# Patient Record
Sex: Female | Born: 1995 | Race: Black or African American | Hispanic: No | Smoking: Never smoker
Health system: Southern US, Community
[De-identification: ages and names within clinical notes are randomized; demographics above are authoritative.]

## PROBLEM LIST (undated history)

## (undated) ENCOUNTER — Ambulatory Visit: Admission: EM | Source: Home / Self Care

## (undated) DIAGNOSIS — G932 Benign intracranial hypertension: Secondary | ICD-10-CM

## (undated) DIAGNOSIS — E282 Polycystic ovarian syndrome: Secondary | ICD-10-CM

## (undated) DIAGNOSIS — G43909 Migraine, unspecified, not intractable, without status migrainosus: Secondary | ICD-10-CM

---

## 2017-10-23 DIAGNOSIS — L853 Xerosis cutis: Secondary | ICD-10-CM | POA: Insufficient documentation

## 2017-11-07 DIAGNOSIS — J029 Acute pharyngitis, unspecified: Secondary | ICD-10-CM | POA: Insufficient documentation

## 2017-12-19 DIAGNOSIS — S6991XA Unspecified injury of right wrist, hand and finger(s), initial encounter: Secondary | ICD-10-CM | POA: Insufficient documentation

## 2020-03-24 DIAGNOSIS — R519 Headache, unspecified: Secondary | ICD-10-CM | POA: Insufficient documentation

## 2020-05-01 DIAGNOSIS — Z6841 Body Mass Index (BMI) 40.0 and over, adult: Secondary | ICD-10-CM | POA: Insufficient documentation

## 2020-09-14 DIAGNOSIS — H66003 Acute suppurative otitis media without spontaneous rupture of ear drum, bilateral: Secondary | ICD-10-CM | POA: Insufficient documentation

## 2021-09-30 ENCOUNTER — Emergency Department
Admission: EM | Admit: 2021-09-30 | Discharge: 2021-09-30 | Disposition: A | Discharge status: Home / Self Care | Payer: BC Managed Care – PPO | Attending: Emergency Medicine | Admitting: Emergency Medicine

## 2021-09-30 ENCOUNTER — Other Ambulatory Visit: Payer: Self-pay

## 2021-09-30 ENCOUNTER — Encounter: Payer: Self-pay | Admitting: Emergency Medicine

## 2021-09-30 DIAGNOSIS — J0100 Acute maxillary sinusitis, unspecified: Secondary | ICD-10-CM | POA: Insufficient documentation

## 2021-09-30 DIAGNOSIS — Z20822 Contact with and (suspected) exposure to covid-19: Secondary | ICD-10-CM | POA: Diagnosis not present

## 2021-09-30 DIAGNOSIS — R519 Headache, unspecified: Secondary | ICD-10-CM | POA: Diagnosis present

## 2021-09-30 HISTORY — DX: Benign intracranial hypertension: G93.2

## 2021-09-30 HISTORY — DX: Migraine, unspecified, not intractable, without status migrainosus: G43.909

## 2021-09-30 LAB — RESP PANEL BY RT-PCR (FLU A&B, COVID) ARPGX2
Influenza A by PCR: NEGATIVE
Influenza B by PCR: NEGATIVE
SARS Coronavirus 2 by RT PCR: NEGATIVE

## 2021-09-30 MED ORDER — DEXAMETHASONE 6 MG PO TABS
10.0000 mg | ORAL_TABLET | Freq: Once | ORAL | Status: AC
  Administered 2021-09-30: 10 mg via ORAL
  Filled 2021-09-30: qty 1

## 2021-09-30 MED ORDER — CETIRIZINE HCL 10 MG PO TABS: 10.0000 mg | ORAL_TABLET | Freq: Every day | ORAL | 1 refills | Status: DC

## 2021-09-30 MED ORDER — IBUPROFEN 800 MG PO TABS
800.0000 mg | ORAL_TABLET | Freq: Once | ORAL | Status: AC
  Administered 2021-09-30: 800 mg via ORAL
  Filled 2021-09-30: qty 1

## 2021-09-30 MED ORDER — FLUTICASONE PROPIONATE 50 MCG/ACT NA SUSP: 1.0000 | Freq: Every day | NASAL | 1 refills | Status: DC

## 2021-09-30 NOTE — ED Triage Notes (Signed)
Pt c/o L sided facial pain and L earache and nasal congestion x 2-3 days, states last known sick exposure approx 2 weeks ago.  ?

## 2021-09-30 NOTE — ED Provider Notes (Signed)
? ?[facility] ?Provider Note ? ? ? Event Date/Time  ? First MD Initiated Contact with Patient 09/30/21 0522   ?  (approximate) ? ? ?History  ? ?Facial Pain ? ? ?HPI ? ?Alexis Chandler is a 26 y.o. female with history of pseudotumor cerebri, migraines who presents to the emergency department with complaints of left-sided headache behind her eye, sinus pressure, nasal congestion for the past 2 days.  No fevers.  No chest pain or shortness of breath.  No vomiting or diarrhea.  No known sick contacts.  She denies any vision changes, numbness, tingling or weakness.  States she started having bilateral ear pain as well. ? ? ?History provided by patient. ? ? ? ?Past Medical History:  ?Diagnosis Date  ? Migraine   ? Pseudotumor cerebri   ? ? ?Past Surgical History:  ?Procedure Laterality Date  ? CESAREAN SECTION    ? ? ?MEDICATIONS:  ?Prior to Admission medications   ?Medication Sig Start Date End Date Taking? Authorizing Provider  ?cetirizine (ZYRTEC ALLERGY) 10 MG tablet Take 1 tablet (10 mg total) by mouth daily. 09/30/21 09/30/22 Yes Emelin Dascenzo, Layla Maw, DO  ?fluticasone (FLONASE) 50 MCG/ACT nasal spray Place 1 spray into both nostrils daily. 09/30/21 09/30/22 Yes Yaresly Menzel, Layla Maw, DO  ? ? ?Physical Exam  ? ?Triage Vital Signs: ?ED Triage Vitals [09/30/21 0239]  ?Enc Vitals Group  ?   BP 140/82  ?   Pulse Rate 99  ?   Resp 20  ?   Temp 99 ?F (37.2 ?C)  ?   Temp Source Oral  ?   SpO2 95 %  ?   Weight 230 lb (104.3 kg)  ?   Height 5\' 4"  (1.626 m)  ?   Head Circumference   ?   Peak Flow   ?   Pain Score 6  ?   Pain Loc   ?   Pain Edu?   ?   Excl. in [place]?   ? ? ?Most recent vital signs: ?Vitals:  ? 09/30/21 0239 09/30/21 0557  ?BP: 140/82 116/81  ?Pulse: 99 84  ?Resp: 20 18  ?Temp: 99 ?F (37.2 ?C)   ?SpO2: 95% 99%  ? ? ?CONSTITUTIONAL: Alert and oriented and responds appropriately to questions. Well-appearing; well-nourished ?HEAD: Normocephalic, atraumatic ?EYES: Conjunctivae clear, pupils appear equal, sclera  nonicteric ?ENT: normal nose; moist mucous membranes; No pharyngeal erythema or petechiae, no tonsillar hypertrophy or exudate, no uvular deviation, no unilateral swelling in posterior oropharynx, no trismus or drooling, no muffled voice, normal phonation, no stridor, airway patent.  TMs are clear bilaterally without erythema, purulence, bulging, perforation.  No cerumen impaction or sign of foreign body in the external auditory canal. No inflammation, erythema or drainage from the external auditory canal. No signs of mastoiditis. No pain with manipulation of the pinna bilaterally.  She does have clear effusions noted behind TMs bilaterally. ?NECK: Supple, normal ROM, no meningismus ?CARD: RRR; S1 and S2 appreciated; no murmurs, no clicks, no rubs, no gallops ?RESP: Normal chest excursion without splinting or tachypnea; breath sounds clear and equal bilaterally; no wheezes, no rhonchi, no rales, no hypoxia or respiratory distress, speaking full sentences ?ABD/GI: Normal bowel sounds; non-distended; soft, non-tender, no rebound, no guarding, no peritoneal signs ?BACK: The back appears normal ?EXT: Normal ROM in all joints; no deformity noted, no edema; no cyanosis ?SKIN: Normal color for age and race; warm; no rash on exposed skin ?NEURO: Moves all extremities equally, normal speech, normal sensation  diffusely, no facial asymmetry, normal gait ?PSYCH: The patient's mood and manner are appropriate. ? ? ?ED Results / Procedures / Treatments  ? ?LABS: ?(all labs ordered are listed, but only abnormal results are displayed) ?Labs Reviewed  ?RESP PANEL BY RT-PCR (FLU A&B, COVID) [id number]  ? ? ? ?EKG: ? ? ?RADIOLOGY: ?My personal review and interpretation of imaging:   ? ?I have personally reviewed all radiology reports.   ?No results found. ? ? ?PROCEDURES: ? ?Critical Care performed: No ? ? ?CRITICAL CARE ?Performed by: Baxter Hire Marquisa Salih ? ? ?Total critical care time: 0 minutes ? ?Critical care time was exclusive of separately  billable procedures and treating other patients. ? ?Critical care was necessary to treat or prevent imminent or life-threatening deterioration. ? ?Critical care was time spent personally by me on the following activities: development of treatment plan with patient and/or surrogate as well as nursing, discussions with consultants, evaluation of patient's response to treatment, examination of patient, obtaining history from patient or surrogate, ordering and performing treatments and interventions, ordering and review of laboratory studies, ordering and review of radiographic studies, pulse oximetry and re-evaluation of patient's condition. ? ? ?Procedures ? ? ? ?IMPRESSION / MDM / ASSESSMENT AND PLAN / ED COURSE  ?I reviewed the triage vital signs and the nursing notes. ? ? ? ?Patient here with complaints of headache, sinus pressure, ear pain, congestion. ? ? ? ?DIFFERENTIAL DIAGNOSIS (includes but not limited to):   Viral illness, allergies, sinusitis, COVID, influenza, otitis media, otitis externa, mastoiditis, pharyngitis ? ? ?PLAN: We will obtain COVID and flu swabs.  Will give ibuprofen and Decadron for symptomatic relief.  Clinically she has no sign of bacterial infection on exam and is well-appearing.  She states this does not feel like her pseudotumor and she has no focal neurologic deficits are vision changes.  She does describe the headache as a "migraine".  I have offered her IV medications such as a migraine cocktail however she declined stating that she would just like to do oral medication. ? ? ?MEDICATIONS GIVEN IN ED: ?Medications  ?ibuprofen (ADVIL) tablet 800 mg (800 mg Oral Given 09/30/21 0602)  ?dexamethasone (DECADRON) tablet 10 mg (10 mg Oral Given 09/30/21 0601)  ? ? ? ?ED COURSE: Patient's COVID and flu swabs were negative.  She states she does not want to wait any longer to see if the medications are making her feel better as she would like to go home because she has a young child with her.   Discussed at length return precautions and recommended alternating Tylenol, Motrin over-the-counter for pain.  Will discharge with Zyrtec and Flonase.  I do not feel that she needs antibiotics today.  We discussed return precautions.  I do not think that she needs a lumbar tap.  Low suspicion for pseudotumor, meningitis. ? ? ?At this time, I do not feel there is any life-threatening condition present. I reviewed all nursing notes, vitals, pertinent previous records.  All lab and urine results, EKGs, imaging ordered have been independently reviewed and interpreted by myself.  I reviewed all available radiology reports from any imaging ordered this visit.  Based on my assessment, I feel the patient is safe to be discharged home without further emergent workup and can continue workup as an outpatient as needed. Discussed all findings, treatment plan as well as usual and customary return precautions with patient.  They verbalize understanding and are comfortable with this plan.  Outpatient follow-up has been provided as needed.  All  questions have been answered. ? ? ? ?CONSULTS: No admission needed at this time given patient well-appearing, hemodynamically stable and neurologically intact. ? ? ?OUTSIDE RECORDS REVIEWED: Reviewed patient's last office visit with family medicine with Earney Mallet on 09/13/2020. ? ? ? ? ? ? ? ? ?FINAL CLINICAL IMPRESSION(S) / ED DIAGNOSES  ? ?Final diagnoses:  ?Acute non-recurrent maxillary sinusitis  ? ? ? ?Rx / DC Orders  ? ?ED Discharge Orders   ? ?      Ordered  ?  fluticasone (FLONASE) 50 MCG/ACT nasal spray  Daily       ? 09/30/21 0538  ?  cetirizine (ZYRTEC ALLERGY) 10 MG tablet  Daily       ? 09/30/21 0538  ? ?  ?  ? ?  ? ? ? ?Note:  This document was prepared using Dragon voice recognition software and may include unintentional dictation errors. ?  ?Elowen Debruyn, Layla Maw, DO ?09/30/21 1683 ? ?

## 2021-09-30 NOTE — Discharge Instructions (Addendum)
You may alternate Tylenol 1000 mg every 6 hours as needed for pain, fever and Ibuprofen 800 mg every 6-8 hours as needed for pain, fever.  Please take Ibuprofen with food.  Do not take more than 4000 mg of Tylenol (acetaminophen) in a 24 hour period.  Steps to find a Primary Care Provider (PCP):  Call [phone] or [phone] to access "[facility] Find a Doctor Service."  2.  You may also go on the [place] [facility] website at [email or web address]  

## 2021-10-21 ENCOUNTER — Emergency Department: Payer: BC Managed Care – PPO

## 2021-10-21 ENCOUNTER — Other Ambulatory Visit: Payer: Self-pay

## 2021-10-21 ENCOUNTER — Emergency Department
Admission: EM | Admit: 2021-10-21 | Discharge: 2021-10-21 | Disposition: A | Discharge status: Home / Self Care | Payer: BC Managed Care – PPO | Attending: Emergency Medicine | Admitting: Emergency Medicine

## 2021-10-21 DIAGNOSIS — R519 Headache, unspecified: Secondary | ICD-10-CM | POA: Diagnosis present

## 2021-10-21 DIAGNOSIS — H538 Other visual disturbances: Secondary | ICD-10-CM | POA: Diagnosis not present

## 2021-10-21 DIAGNOSIS — G932 Benign intracranial hypertension: Secondary | ICD-10-CM

## 2021-10-21 LAB — BASIC METABOLIC PANEL
Anion gap: 10 (ref 5–15)
BUN: 10 mg/dL (ref 6–20)
CO2: 23 mmol/L (ref 22–32)
Calcium: 8.8 mg/dL — ABNORMAL LOW (ref 8.9–10.3)
Chloride: 108 mmol/L (ref 98–111)
Creatinine, Ser: 0.78 mg/dL (ref 0.44–1.00)
GFR, Estimated: >60 mL/min (ref >60)
Glucose, Bld: 114 mg/dL — ABNORMAL HIGH (ref 70–99)
Potassium: 3.7 mmol/L (ref 3.5–5.1)
Sodium: 141 mmol/L (ref 135–145)

## 2021-10-21 LAB — HCG, QUANTITATIVE, PREGNANCY: hCG, Beta Chain, Quant, S: <1 m[IU]/mL (ref <5)

## 2021-10-21 LAB — CBC
HCT: 36 % (ref 36.0–46.0)
Hemoglobin: 11.3 g/dL — ABNORMAL LOW (ref 12.0–15.0)
MCH: 26 pg (ref 26.0–34.0)
MCHC: 31.4 g/dL (ref 30.0–36.0)
MCV: 82.9 fL (ref 80.0–100.0)
Platelets: 372 10*3/uL (ref 150–400)
RBC: 4.34 MIL/uL (ref 3.87–5.11)
RDW: 14.5 % (ref 11.5–15.5)
WBC: 9.3 10*3/uL (ref 4.0–10.5)
nRBC: 0 % (ref 0.0–0.2)

## 2021-10-21 MED ORDER — METOCLOPRAMIDE HCL 5 MG/ML IJ SOLN
10.0000 mg | Freq: Once | INTRAMUSCULAR | Status: AC
  Administered 2021-10-21: 10 mg via INTRAVENOUS
  Filled 2021-10-21: qty 2

## 2021-10-21 MED ORDER — ACETAZOLAMIDE 250 MG PO TABS
500.0000 mg | ORAL_TABLET | Freq: Once | ORAL | Status: AC
Start: 2021-10-21 — End: 2021-10-21
  Administered 2021-10-21: 500 mg via ORAL
  Filled 2021-10-21: qty 2

## 2021-10-21 MED ORDER — SODIUM CHLORIDE 0.9 % IV BOLUS
1000.0000 mL | Freq: Once | INTRAVENOUS | Status: AC
Start: 2021-10-21 — End: 2021-10-21
  Administered 2021-10-21: 1000 mL via INTRAVENOUS

## 2021-10-21 MED ORDER — KETOROLAC TROMETHAMINE 30 MG/ML IJ SOLN
30.0000 mg | Freq: Once | INTRAMUSCULAR | Status: AC
  Administered 2021-10-21: 30 mg via INTRAVENOUS
  Filled 2021-10-21: qty 1

## 2021-10-21 MED ORDER — DIPHENHYDRAMINE HCL 50 MG/ML IJ SOLN
50.0000 mg | Freq: Once | INTRAMUSCULAR | Status: AC
  Administered 2021-10-21: 50 mg via INTRAVENOUS
  Filled 2021-10-21: qty 1

## 2021-10-21 MED ORDER — ACETAZOLAMIDE ER 500 MG PO CP12: 500.0000 mg | ORAL_CAPSULE | Freq: Two times a day (BID) | ORAL | 2 refills | Status: DC

## 2021-10-21 NOTE — ED Triage Notes (Signed)
Pt states has pseudotumor cerebri. Pt states since 0900 this am she has had a "white film" over her left eye with associated headache and left eye pressure. Pt denies nausea, known fever.  ?

## 2021-10-21 NOTE — ED Notes (Signed)
Per provider, the Diamox is to be held until pregnancy status is determined. Patient has been medicated and fluids hung. She states she does not need to urinate at this time. Will check back shortly and attempt to get urine specimen at that time. Provider is aware. Warm blanket provided to patient. ?

## 2021-10-21 NOTE — Discharge Instructions (Addendum)
Please begin taking medication starting tomorrow 10/22/2021 twice daily.  Please follow-up with neurology by calling the number provided tomorrow morning to arrange a follow-up appointment as soon as possible preferably within the next 2 to 3 weeks.  Return to the emergency department if your vision does not improve over the next several days or if at any point it appears to worsen. ?Please also call the number provided for ophthalmology tomorrow morning to arrange a follow-up appointment for further evaluation. ?

## 2021-10-21 NOTE — ED Provider Notes (Signed)
? ?[facility] ?Provider Note ? ? ? Event Date/Time  ? First MD Initiated Contact with Patient 10/21/21 1952   ?  (approximate) ? ?History  ? ?Chief Complaint: Pressure Behind the Eyes ? ?HPI ? ?Alexis Chandler is a 26 y.o. female with a past medical history of pseudotumor cerebri who presents to the emergency department for headache and visual symptoms.  According to the patient since this morning she has been experiencing a migraine type headache, states she also has a history of migraines.  States she has noted some pressure and slight haze/blurred vision in her left eye.  Patient denies any weakness or numbness of any arm or leg confusion or slurred speech.  Patient states a history of migraine headaches in the past.  Patient was diagnosed with pseudotumor cerebri follows up with neurology but has never had a lumbar puncture.  Last menstrual period was last week. ? ?Physical Exam  ? ?Triage Vital Signs: ?ED Triage Vitals [10/21/21 1917]  ?Enc Vitals Group  ?   BP (!) 141/88  ?   Pulse Rate 79  ?   Resp 16  ?   Temp 98 ?F (36.7 ?C)  ?   Temp Source Oral  ?   SpO2 99 %  ?   Weight 240 lb (108.9 kg)  ?   Height 5\' 4"  (1.626 m)  ?   Head Circumference   ?   Peak Flow   ?   Pain Score 8  ?   Pain Loc   ?   Pain Edu?   ?   Excl. in [place]?   ? ? ?Most recent vital signs: ?Vitals:  ? 10/21/21 1917  ?BP: (!) 141/88  ?Pulse: 79  ?Resp: 16  ?Temp: 98 ?F (36.7 ?C)  ?SpO2: 99%  ? ? ?General: Awake, no distress.  ?CV:  Good peripheral perfusion.  Regular rate and rhythm  ?Resp:  Normal effort.  Equal breath sounds bilaterally.  ?Abd:  No distention.  Soft, nontender.  No rebound or guarding. ?Other:  Well-appearing, nontoxic. ? ? ?ED Results / Procedures / Treatments  ? ?RADIOLOGY ? ?I personally reviewed the CT images no acute abnormality seen on my evaluation. ?Radiology is read the CT as fluid attenuation around the intraorbital optic nerves likely reflecting prominent subarachnoid spaces suggesting  increased intracranial pressure. ? ? ?MEDICATIONS ORDERED IN ED: ?Medications  ?ketorolac (TORADOL) 30 MG/ML injection 30 mg (has no administration in time range)  ?metoCLOPramide (REGLAN) injection 10 mg (has no administration in time range)  ?diphenhydrAMINE (BENADRYL) injection 50 mg (has no administration in time range)  ?sodium chloride 0.9 % bolus 1,000 mL (has no administration in time range)  ? ? ? ?IMPRESSION / MDM / ASSESSMENT AND PLAN / ED COURSE  ?I reviewed the triage vital signs and the nursing notes. ? ?Patient presents to the emergency department with a headache and some left visual haziness/blurriness.  Patient states headache since this morning.  Feels like a migraine has a history of migraines in the past.  Patient states he was also diagnosed with pseudotumor cerebri by her neurologist but has never had a lumbar puncture.  Given the patient's history of migraines we will treat the patient's migraine with Toradol Reglan Benadryl and IV fluids.  We will continue to closely monitor to see if symptoms improve.  If symptoms improve with treatment anticipate discharge home.  If symptoms fail to improve we could attempt a lumbar puncture although this could be quite difficult  given body habitus.  Patient is agreeable to this plan. ? ?Patient states she is feeling better after medications states she continues to have a slight visual disturbance in her left eye but is much better.  Suspect is likely related to pseudotumor cerebri we will start the patient on acetazolamide 500 mg twice daily and have the patient follow-up with neurology.  Discussed return precautions if the visual symptoms do not improve or if anytime they worsen.  Patient agreeable to plan. ? ?FINAL CLINICAL IMPRESSION(S) / ED DIAGNOSES  ? ?Headache ? ?Rx / DC Orders  ? ?Acetazolamide 500 mg twice daily ? ?Note:  This document was prepared using Dragon voice recognition software and may include unintentional dictation errors. ?   Harvest Dark, MD ?10/21/21 2245 ? ?

## 2021-10-21 NOTE — ED Notes (Addendum)
This nurse noted that the HCG still had not resulted and it was discovered that the test hadnt been started. I requested that they please expedite that so that I may administer a medication.I was told they would complete it immediately. Provider was made aware. ? ?Patient is resting comfortably on stretcher with eyes closed. RR even and unlabored. Patient verbalizes no new complaints or needs at this time. Visitor at bedside. ?

## 2021-10-22 ENCOUNTER — Emergency Department: Payer: BC Managed Care – PPO | Admitting: Radiology

## 2021-10-22 ENCOUNTER — Emergency Department
Admission: EM | Admit: 2021-10-22 | Discharge: 2021-10-22 | Disposition: A | Discharge status: Home / Self Care | Payer: BC Managed Care – PPO | Attending: Emergency Medicine | Admitting: Emergency Medicine

## 2021-10-22 ENCOUNTER — Other Ambulatory Visit: Payer: Self-pay

## 2021-10-22 DIAGNOSIS — G932 Benign intracranial hypertension: Secondary | ICD-10-CM | POA: Diagnosis not present

## 2021-10-22 DIAGNOSIS — R519 Headache, unspecified: Secondary | ICD-10-CM | POA: Diagnosis present

## 2021-10-22 HISTORY — PX: IR FL GUIDED LOC OF NEEDLE/CATH TIP FOR SPINAL INJECTION LT: IMG2396

## 2021-10-22 LAB — CSF CELL COUNT WITH DIFFERENTIAL
RBC Count, CSF: 0 /mm3 (ref 0–3)
Tube #: 3
WBC, CSF: 0 /mm3 (ref 0–5)

## 2021-10-22 LAB — PROTEIN AND GLUCOSE, CSF
Glucose, CSF: 51 mg/dL (ref 40–70)
Total  Protein, CSF: 22 mg/dL (ref 15–45)

## 2021-10-22 MED ORDER — METOCLOPRAMIDE HCL 5 MG/ML IJ SOLN
10.0000 mg | Freq: Once | INTRAMUSCULAR | Status: AC
  Administered 2021-10-22: 10 mg via INTRAVENOUS
  Filled 2021-10-22: qty 2

## 2021-10-22 MED ORDER — KETOROLAC TROMETHAMINE 30 MG/ML IJ SOLN
15.0000 mg | Freq: Once | INTRAMUSCULAR | Status: AC
  Administered 2021-10-22: 15 mg via INTRAVENOUS
  Filled 2021-10-22: qty 1

## 2021-10-22 MED ORDER — LIDOCAINE HCL 1 % IJ SOLN
INTRAMUSCULAR | Status: AC
Start: 2021-10-22 — End: 2021-10-22
  Administered 2021-10-22: 6 mL
  Filled 2021-10-22: qty 20

## 2021-10-22 NOTE — Discharge Instructions (Addendum)
Please continue to take the Diamox as prescribed.  Is important that you follow-up with both ophthalmology and neurology. ?

## 2021-10-22 NOTE — ED Triage Notes (Signed)
First Nurse Note:  Seen last night for pseudotumor cerebri.  States c/o lower facial numbness and left hand numbness this morning. ? ?AAOx3.  Skin warm and dry. Speech clear. Facial movmenets equal. MAE equally and strong. NAD ?

## 2021-10-22 NOTE — Procedures (Signed)
PROCEDURE SUMMARY: ? ?Successful fluoroscopic guided Lumbar puncture ?Opening pressure 36 cm of H20 ?Yielded 20 mL of colorless CSF fluid ?Closing pressure 22 cm of H20 ?No immediate complications.  ?Pt tolerated well.  ? ?Specimen was sent for labs. ? ?EBL < 42mL ? ?Pattricia Boss D PA-C ?10/22/2021 ?5:37 PM ? ? ? ?

## 2021-10-22 NOTE — ED Notes (Signed)
Pt returned from MRI, pt educated to lay flat for 1 hour, pt verbalized understanding. NAD noted. S/O at bedside.  ?

## 2021-10-22 NOTE — ED Notes (Signed)
Visual acuity: ?R eye 20/70 ?L eye 20/100 ?Both eyes 20/40 ? ?Pt denies the need to wear glasses or contact lenses normally.  ?

## 2021-10-22 NOTE — ED Triage Notes (Signed)
See first nurse note- States she was seen here last night for a mirgaine related to pseudotumor cerebri. This morning woke up with bilateral hand numbness and lower facial numbness. Still having a migraine type headache, some pressure and slight blurred vision in her left eye. Reports she was given Reglan/ benadryl/ toradol and this initially helped the headache but it has since returned.  ? ?Denies any weakness/ slurred speech. Does report some disorientation.  ?

## 2021-10-22 NOTE — ED Provider Notes (Signed)
----------------------------------------- ?  3:54 PM on 10/22/2021 ?----------------------------------------- ? ?Blood pressure (!) 149/77, pulse 79, temperature 98.6 ?F (37 ?C), temperature source Oral, resp. rate 18, height 5\' 4"  (1.626 m), weight 108.9 kg, last menstrual period 10/17/2021, SpO2 100 %. ? ?Assuming care from Dr. 10/19/2021.  In short, Alexis Chandler is a 26 y.o. female with a chief complaint of No chief complaint on file. ?.  Refer to the original H&P for additional details. ? ?The current plan of care is to follow-up following therapeutic LP due to concern for elevated ICP and visual changes. ? ?----------------------------------------- ?7:02 PM on 10/22/2021 ?----------------------------------------- ?LP performed with fluoroscopy and shows elevated opening pressure of 36 consistent with idiopathic intracranial hypertension.  Cell counts, protein, and glucose levels in CSF are unremarkable and patient reports improvement in headache following lumbar puncture.  She is appropriate for discharge home with outpatient neurology and ophthalmology follow-up, counseled to continue Diamox.  Patient agrees with plan. ? ?  ?10/24/2021, MD ?10/22/21 1904 ? ?

## 2021-10-22 NOTE — ED Notes (Signed)
Pt presents to ED with a migraine and numbness to lower jaw and L hand this morning. Pt states seen here last night for migraine and discharged, pt states she is know having new numbness. Pt was ambulatory to bed with steady gait and is using all extremities purposefully and pt is also drinking a SONIC drink at this time. NAD noted. S/O with pt. ? ?Pt states HX of pseudomotor cerebri DX.  ?

## 2021-10-22 NOTE — ED Provider Notes (Signed)
3 ? ?[facility] ?Provider Note ? ? ? Event Date/Time  ? First MD Initiated Contact with Patient 10/22/21 1554   ?  (approximate) ? ? ?History  ? ?No chief complaint on file. ? ? ?HPI ? ?Sharica Roedel is a 26 y.o. female with past medical history of diagnosis of pseudotumor cerebri which was diagnosed based on MRI [place] an [place] hospital in 2020 who presents with vision loss and bilateral hand numbness.  Patient has almost daily migraine headaches.  Presented to the ED yesterday with difficulty seeing out of the left eye as well as typical headache.  She received a migraine cocktail and CT at that time showed some increased fluid around the optic nerves in keeping with likely increased intracranial pressure however she felt improved after migraine cocktail was ultimately discharged.  Returns today with ongoing visual symptoms as well as tingling in the bilateral hands and face.  She was started on Diamox.  Previously was not undergoing any treatment for her pseudotumor and has not ever had an LP.  Denies weakness.  Denies diplopia. ?  ? ?Past Medical History:  ?Diagnosis Date  ? Migraine   ? Pseudotumor cerebri   ? ? ?There are no problems to display for this patient. ? ? ? ?Physical Exam  ?Triage Vital Signs: ?ED Triage Vitals [10/22/21 1209]  ?Enc Vitals Group  ?   BP 138/79  ?   Pulse Rate 80  ?   Resp 18  ?   Temp 98.6 ?F (37 ?C)  ?   Temp Source Oral  ?   SpO2 95 %  ?   Weight 240 lb (108.9 kg)  ?   Height 5\' 4"  (1.626 m)  ?   Head Circumference   ?   Peak Flow   ?   Pain Score 7  ?   Pain Loc   ?   Pain Edu?   ?   Excl. in [place]?   ? ? ?Most recent vital signs: ?Vitals:  ? 10/22/21 1507 10/22/21 1515  ?BP: 128/88 (!) 149/77  ?Pulse: 80 79  ?Resp: 19 18  ?Temp:    ?SpO2: 100% 100%  ? ? ? ?General: Awake, no distress.  ?CV:  Good peripheral perfusion.  ?Resp:  Normal effort.  ?Abd:  No distention.  ?Neuro:             Awake, Alert, Oriented x 3  ?Other:  Aox3, nml speech  ?PERRL, EOMI, face  symmetric, nml tongue movement  ?5/5 strength in the BL upper and lower extremities  ?Sensation grossly intact in the BL upper and lower extremities  ?Finger-nose-finger intact BL ? ?VA 20/40 BL  ? ? ?ED Results / Procedures / Treatments  ?Labs ?(all labs ordered are listed, but only abnormal results are displayed) ?Labs Reviewed - No data to display ? ? ?EKG ? ? ? ? ?RADIOLOGY ? ? ? ?PROCEDURES: ? ?Critical Care performed: No ? ?.Lumbar Puncture ? ?Date/Time: 10/22/2021 4:07 PM ?Performed by: 10/24/2021, MD ?Authorized by: Georga Hacking, MD  ? ?Consent:  ?  Consent obtained:  Verbal and written ?  Risks discussed:  Bleeding, infection, pain, repeat procedure and nerve damage ?  Alternatives discussed:  Alternative treatment and referral ?Universal protocol:  ?  Patient identity confirmed:  Verbally with patient ?Pre-procedure details:  ?  Procedure purpose:  Therapeutic ?  Preparation: Patient was prepped and draped in usual sterile fashion   ?Anesthesia:  ?  Anesthesia  method:  Local infiltration ?  Local anesthetic:  Lidocaine 1% w/o epi ?Procedure details:  ?  Lumbar space:  L4-L5 interspace ?  Patient position:  L lateral decubitus ?  Needle gauge:  20 ?  Needle type:  Spinal needle - Quincke tip ?  Needle length (in):  2.5 ?  Ultrasound guidance: no   ?  Number of attempts:  2 ?Post-procedure details:  ?  Procedure completion:  Tolerated well, no immediate complications ?Comments:  ?   Unable to get CSF ? ?The patient is on the cardiac monitor to evaluate for evidence of arrhythmia and/or significant heart rate changes. ? ? ?MEDICATIONS ORDERED IN ED: ?Medications  ?ketorolac (TORADOL) 30 MG/ML injection 15 mg (15 mg Intravenous Given 10/22/21 1352)  ?metoCLOPramide (REGLAN) injection 10 mg (10 mg Intravenous Given 10/22/21 1352)  ? ? ? ?IMPRESSION / MDM / ASSESSMENT AND PLAN / ED COURSE  ?I reviewed the triage vital signs and the nursing notes. ?             ?               ? ?Differential diagnosis  includes, but is not limited to, visual loss secondary to raised intracranial pressure, migraine headache ? ?] Patient is a 26 year old female who carries a diagnosis of pseudotumor cerebri but has never had an LP presents with vision loss and bilateral hand numbness.  She had visual loss starting yesterday which is not typical of her prior migraines.  Received migraine cocktail in the ED and felt improved and was discharged on Diamox with neurology follow-up however she presents today with ongoing visual symptoms.  On exam she has no focal deficits.  Visual acuity is 20/40 in bilateral eyes.  She feels primarily subjective decrease of the left temporal visual field in the left eye.  Reviewed CT imaging obtained yesterday which does show signs of likely raised intracranial pressure.  Discussed with neurology on-call who recommends LP both for diagnostic and therapeutic purposes.  Agrees with continuing Diamox and arranging for close neurology and ophthalmology follow-up. ? ?Attempted LP at bedside but unfortunately secondary to patient's body habitus this was difficult and unsuccessful.  Discussed with radiology department will arrange for LP.  Patient will be given ophthalmology and neurology follow-up and advised to continue Diamox. ? ?  ? ? ?FINAL CLINICAL IMPRESSION(S) / ED DIAGNOSES  ? ?Final diagnoses:  ?Pseudotumor cerebri  ? ? ? ?Rx / DC Orders  ? ?ED Discharge Orders   ? ? None  ? ?  ? ? ? ?Note:  This document was prepared using Dragon voice recognition software and may include unintentional dictation errors. ?  ?Georga Hacking, MD ?10/22/21 1612 ? ?

## 2021-10-22 NOTE — ED Notes (Signed)
Pt to IR

## 2022-02-08 ENCOUNTER — Other Ambulatory Visit: Payer: Self-pay

## 2022-02-08 ENCOUNTER — Emergency Department
Admission: EM | Admit: 2022-02-08 | Discharge: 2022-02-09 | Discharge status: Against Medical Advice | Payer: BC Managed Care – PPO | Attending: Emergency Medicine | Admitting: Emergency Medicine

## 2022-02-08 DIAGNOSIS — Z5321 Procedure and treatment not carried out due to patient leaving prior to being seen by health care provider: Secondary | ICD-10-CM | POA: Insufficient documentation

## 2022-02-08 DIAGNOSIS — M65029 Abscess of tendon sheath, unspecified upper arm: Secondary | ICD-10-CM | POA: Insufficient documentation

## 2022-02-08 NOTE — ED Triage Notes (Signed)
Pt noticed she had a small abscess under her arm. Area is red and very painful. States it has been ongoing for the past few weeks.

## 2022-02-19 ENCOUNTER — Encounter (HOSPITAL_COMMUNITY): Payer: Self-pay | Admitting: *Deleted

## 2022-02-19 ENCOUNTER — Emergency Department (HOSPITAL_COMMUNITY)
Admission: EM | Admit: 2022-02-19 | Discharge: 2022-02-20 | Discharge status: Against Medical Advice | Payer: 59 | Attending: Emergency Medicine | Admitting: Emergency Medicine

## 2022-02-19 ENCOUNTER — Other Ambulatory Visit: Payer: Self-pay

## 2022-02-19 DIAGNOSIS — Z5321 Procedure and treatment not carried out due to patient leaving prior to being seen by health care provider: Secondary | ICD-10-CM | POA: Diagnosis not present

## 2022-02-19 DIAGNOSIS — L02414 Cutaneous abscess of left upper limb: Secondary | ICD-10-CM | POA: Insufficient documentation

## 2022-02-19 NOTE — ED Triage Notes (Signed)
Pt states she has several abscesses under her left arm that are red and painful. Areas were draining about a week ago, but no longer draining.

## 2022-02-20 NOTE — ED Notes (Signed)
Patient seen walking out of ED

## 2022-03-13 ENCOUNTER — Emergency Department: Payer: 59

## 2022-03-13 ENCOUNTER — Encounter: Payer: Self-pay | Admitting: Emergency Medicine

## 2022-03-13 ENCOUNTER — Other Ambulatory Visit: Payer: Self-pay

## 2022-03-13 ENCOUNTER — Emergency Department
Admission: EM | Admit: 2022-03-13 | Discharge: 2022-03-14 | Disposition: A | Discharge status: Home / Self Care | Payer: 59 | Attending: Emergency Medicine | Admitting: Emergency Medicine

## 2022-03-13 DIAGNOSIS — G932 Benign intracranial hypertension: Secondary | ICD-10-CM | POA: Insufficient documentation

## 2022-03-13 DIAGNOSIS — G43019 Migraine without aura, intractable, without status migrainosus: Secondary | ICD-10-CM | POA: Insufficient documentation

## 2022-03-13 LAB — COMPREHENSIVE METABOLIC PANEL
ALT: 22 U/L (ref 0–44)
AST: 21 U/L (ref 15–41)
Albumin: 3.9 g/dL (ref 3.5–5.0)
Alkaline Phosphatase: 96 U/L (ref 38–126)
Anion gap: 6 (ref 5–15)
BUN: 16 mg/dL (ref 6–20)
CO2: 25 mmol/L (ref 22–32)
Calcium: 9.3 mg/dL (ref 8.9–10.3)
Chloride: 108 mmol/L (ref 98–111)
Creatinine, Ser: 0.81 mg/dL (ref 0.44–1.00)
GFR, Estimated: >60 mL/min (ref >60)
Glucose, Bld: 82 mg/dL (ref 70–99)
Potassium: 4 mmol/L (ref 3.5–5.1)
Sodium: 139 mmol/L (ref 135–145)
Total Bilirubin: 0.5 mg/dL (ref 0.3–1.2)
Total Protein: 7.8 g/dL (ref 6.5–8.1)

## 2022-03-13 LAB — URINALYSIS, ROUTINE W REFLEX MICROSCOPIC
Bilirubin Urine: NEGATIVE
Glucose, UA: NEGATIVE mg/dL
Hgb urine dipstick: NEGATIVE
Ketones, ur: NEGATIVE mg/dL
Leukocytes,Ua: NEGATIVE
Nitrite: NEGATIVE
Protein, ur: NEGATIVE mg/dL
Specific Gravity, Urine: 1.02 (ref 1.005–1.030)
pH: 5 (ref 5.0–8.0)

## 2022-03-13 LAB — CBC WITH DIFFERENTIAL/PLATELET
Abs Immature Granulocytes: 0.02 10*3/uL (ref 0.00–0.07)
Basophils Absolute: 0.1 10*3/uL (ref 0.0–0.1)
Basophils Relative: 1 %
Eosinophils Absolute: 0.2 10*3/uL (ref 0.0–0.5)
Eosinophils Relative: 2 %
HCT: 39.7 % (ref 36.0–46.0)
Hemoglobin: 12.5 g/dL (ref 12.0–15.0)
Immature Granulocytes: 0 %
Lymphocytes Relative: 48 %
Lymphs Abs: 4.4 10*3/uL — ABNORMAL HIGH (ref 0.7–4.0)
MCH: 26.3 pg (ref 26.0–34.0)
MCHC: 31.5 g/dL (ref 30.0–36.0)
MCV: 83.4 fL (ref 80.0–100.0)
Monocytes Absolute: 0.7 10*3/uL (ref 0.1–1.0)
Monocytes Relative: 8 %
Neutro Abs: 3.7 10*3/uL (ref 1.7–7.7)
Neutrophils Relative %: 41 %
Platelets: 361 10*3/uL (ref 150–400)
RBC: 4.76 MIL/uL (ref 3.87–5.11)
RDW: 14.4 % (ref 11.5–15.5)
WBC: 9 10*3/uL (ref 4.0–10.5)
nRBC: 0 % (ref 0.0–0.2)

## 2022-03-13 LAB — PREGNANCY, URINE: Preg Test, Ur: NEGATIVE

## 2022-03-13 LAB — LIPASE, BLOOD: Lipase: 39 U/L (ref 11–51)

## 2022-03-13 MED ORDER — BUTALBITAL-APAP-CAFFEINE 50-325-40 MG PO TABS: 1.0000 | ORAL_TABLET | Freq: Four times a day (QID) | ORAL | 0 refills | Status: AC | PRN

## 2022-03-13 MED ORDER — KETOROLAC TROMETHAMINE 30 MG/ML IJ SOLN
30.0000 mg | Freq: Once | INTRAMUSCULAR | Status: AC
  Administered 2022-03-13: 30 mg via INTRAMUSCULAR
  Filled 2022-03-13: qty 1

## 2022-03-13 MED ORDER — SUMATRIPTAN SUCCINATE 6 MG/0.5ML ~~LOC~~ SOLN
6.0000 mg | Freq: Once | SUBCUTANEOUS | Status: AC
  Administered 2022-03-13: 6 mg via SUBCUTANEOUS
  Filled 2022-03-13: qty 0.5

## 2022-03-13 MED ORDER — PROMETHAZINE HCL 25 MG/ML IJ SOLN: 25.0000 mg | Freq: Four times a day (QID) | INTRAMUSCULAR | Status: DC | PRN

## 2022-03-13 MED ORDER — DIPHENHYDRAMINE HCL 50 MG/ML IJ SOLN
50.0000 mg | Freq: Once | INTRAMUSCULAR | Status: AC
  Administered 2022-03-13: 50 mg via INTRAMUSCULAR
  Filled 2022-03-13: qty 1

## 2022-03-13 NOTE — ED Provider Notes (Signed)
[facility] Provider Note  Patient Contact: 9:42 PM (approximate)   History   Migraine   HPI  Alexis Chandler is a 26 y.o. female who presents the emergency department complaining of migraine headache.  Patient has history of pseudotumor cerebri and gets frequent migraines.  Typically she can manage this with over-the-counter medications.  She tried all of her normal medications without relief of headache.  No atypical symptoms.  Patient states that she was seen here couple months ago 2 days in a row, ultimately had a lumbar puncture to relieve some of the fluid on her brain.  She states that she was referred to neurology but when she contacted them for an appointment her insurance did not pay much of the cost and she would be out-of-pocket too much money to be able to go and see them.  Patient denies any fevers, chills, unilateral weakness.  Again no atypical features of her migraine other than her over-the-counter medications did not alleviate symptoms.     Physical Exam   Triage Vital Signs: ED Triage Vitals [03/13/22 1946]  Enc Vitals Group     BP (!) 148/87     Pulse Rate 73     Resp 19     Temp 98.5 F (36.9 C)     Temp src      SpO2 96 %     Weight      Height      Head Circumference      Peak Flow      Pain Score 7     Pain Loc      Pain Edu?      Excl. in [place]?     Most recent vital signs: Vitals:   03/13/22 1946  BP: (!) 148/87  Pulse: 73  Resp: 19  Temp: 98.5 F (36.9 C)  SpO2: 96%     General: Alert and in no acute distress. Eyes:  PERRL. EOMI.  Neck: No stridor. No cervical spine tenderness to palpation. Cardiovascular:  Good peripheral perfusion Respiratory: Normal respiratory effort without tachypnea or retractions. Lungs CTAB.  Musculoskeletal: Full range of motion to all extremities.  Neurologic:  No gross focal neurologic deficits are appreciated.  Cranial nerves II through XII grossly intact at this time. Skin:   No  rash noted Other:   ED Results / Procedures / Treatments   Labs (all labs ordered are listed, but only abnormal results are displayed) Labs Reviewed  CBC WITH DIFFERENTIAL/PLATELET - Abnormal; Notable for the following components:      Result Value   Lymphs Abs 4.4 (*)    All other components within normal limits  URINALYSIS, ROUTINE W REFLEX MICROSCOPIC - Abnormal; Notable for the following components:   Color, Urine YELLOW (*)    APPearance HAZY (*)    All other components within normal limits  COMPREHENSIVE METABOLIC PANEL  LIPASE, BLOOD  PREGNANCY, URINE     EKG     RADIOLOGY  I personally viewed, evaluated, and interpreted these images as part of my medical decision making, as well as reviewing the written report by the radiologist.  ED Provider Interpretation: No acute infarct, mass effect.  Patient does have some slight fluid attenuation around the intraorbital optic nerve consistent with increased intracranial pressure.  Compared to previous CT scan from 10/21/2021, this does appear slightly improved/decreased  CT Head Wo Contrast  Result Date: 03/13/2022 CLINICAL DATA:  Sudden severe headache EXAM: CT HEAD WITHOUT CONTRAST TECHNIQUE: Contiguous axial images  were obtained from the base of the skull through the vertex without intravenous contrast. RADIATION DOSE REDUCTION: This exam was performed according to the departmental dose-optimization program which includes automated exposure control, adjustment of the mA and/or kV according to patient size and/or use of iterative reconstruction technique. COMPARISON:  CT head 10/21/2021 FINDINGS: Brain: No intracranial hemorrhage, mass effect, or evidence of acute infarct. No hydrocephalus. No extra-axial fluid collection. Vascular: No hyperdense vessel or unexpected calcification. Skull: No fracture or focal lesion. Sinuses/Orbits: Fluid attenuation surrounding the intraorbital optic nerves is redemonstrated though appears  decreased from 10/21/2021. Paranasal sinuses and mastoid air cells are well aerated. Other: None. IMPRESSION: Fluid attenuation around the intra orbital optic nerves suggestive of increased intracranial pressure. This is slightly decreased from 10/21/2021. No hydrocephalus. Electronically Signed   By: Minerva Fester M.D.   On: 03/13/2022 23:17    PROCEDURES:  Critical Care performed: No  Procedures   MEDICATIONS ORDERED IN ED: Medications  ketorolac (TORADOL) 30 MG/ML injection 30 mg (has no administration in time range)  SUMAtriptan (IMITREX) injection 6 mg (has no administration in time range)  promethazine (PHENERGAN) injection 25 mg (has no administration in time range)  diphenhydrAMINE (BENADRYL) injection 50 mg (has no administration in time range)     IMPRESSION / MDM / ASSESSMENT AND PLAN / ED COURSE  I reviewed the triage vital signs and the nursing notes.                              Differential diagnosis includes, but is not limited to, migraine headache, pseudotumor cerebri, increased ICP, intracranial hemorrhage  Patient's presentation is most consistent with acute presentation with potential threat to life or bodily function.   Patient's diagnosis is consistent with migraine headache, pseudotumor cerebri.  Patient presents to the ED with a migraine headache.  This is typical of patient's migraines she states that her medication simply did not alleviate the headache at this time.  Patient takes over-the-counter medication with no prescriptions for her headache relief.  She typically experiences 3-4 migraines a week.  Again no atypical features of the fact that it did not resolve with normal over-the-counter medications.  Patient also noted that she typically drinks caffeine every day and has not had any caffeine over the last 3 days.  Her exam was reassuring.  Given her history we did order labs and imaging.  There was no acute findings on CT scan.  Patient did have some  slight fluid attenuating lesion around the intraorbital optic nerve however this is decreased when compared to previous imaging.  At this time patient will receive migraine cocktail, prescription for at home headache management.  She states that she was supposed to see [facility] clinic for neurology follow-up however they do not accept her insurance and the cost was prohibitive for seeking care.  I discussed other regional options for neurology.  Concerning signs and symptoms to return to the ED are discussed with the patient.  Again follow-up with a neurologist.  Patient is given ED precautions to return to the ED for any worsening or new symptoms.        FINAL CLINICAL IMPRESSION(S) / ED DIAGNOSES   Final diagnoses:  Intractable migraine without aura and without status migrainosus  Pseudotumor cerebri     Rx / DC Orders   ED Discharge Orders          Ordered    butalbital-acetaminophen-caffeine (FIORICET) 50-325-40 MG  tablet  Every 6 hours PRN        03/13/22 2334             Note:  This document was prepared using Dragon voice recognition software and may include unintentional dictation errors.   Lanette Hampshire 03/13/22 2334    Sharman Cheek, MD 03/14/22 [phone]

## 2022-03-13 NOTE — ED Triage Notes (Signed)
Pt presents via Pov with c/o migraine like headache. Pt reports has hx of psuedo tumor cerebri. Pt reports taking Excedrin migraine with no relief in symptoms.pt endorses left sided headache. Light sensitivity with vision changes. Pt reports this is consistent with her normal migraine symptoms.Pt alert and oriented x4.

## 2022-03-13 NOTE — ED Notes (Signed)
E-signature not working at this time. Pt verbalized understanding of D/C instructions, prescriptions and follow up care with no further questions at this time. Pt in NAD and ambulatory at time of D/C.  

## 2022-04-30 DIAGNOSIS — G932 Benign intracranial hypertension: Secondary | ICD-10-CM | POA: Insufficient documentation

## 2022-04-30 DIAGNOSIS — Z01419 Encounter for gynecological examination (general) (routine) without abnormal findings: Secondary | ICD-10-CM | POA: Insufficient documentation

## 2022-04-30 DIAGNOSIS — Z98891 History of uterine scar from previous surgery: Secondary | ICD-10-CM | POA: Insufficient documentation

## 2022-04-30 DIAGNOSIS — N915 Oligomenorrhea, unspecified: Secondary | ICD-10-CM | POA: Insufficient documentation

## 2022-04-30 DIAGNOSIS — Z8742 Personal history of other diseases of the female genital tract: Secondary | ICD-10-CM | POA: Insufficient documentation

## 2022-04-30 DIAGNOSIS — Z8751 Personal history of pre-term labor: Secondary | ICD-10-CM | POA: Insufficient documentation

## 2022-10-05 ENCOUNTER — Emergency Department
Admission: EM | Admit: 2022-10-05 | Discharge: 2022-10-05 | Disposition: A | Discharge status: Home / Self Care | Payer: BLUE CROSS/BLUE SHIELD | Attending: Emergency Medicine | Admitting: Emergency Medicine

## 2022-10-05 ENCOUNTER — Other Ambulatory Visit: Payer: Self-pay

## 2022-10-05 DIAGNOSIS — L739 Follicular disorder, unspecified: Secondary | ICD-10-CM | POA: Insufficient documentation

## 2022-10-05 MED ORDER — CEPHALEXIN 500 MG PO CAPS: 500.0000 mg | ORAL_CAPSULE | Freq: Four times a day (QID) | ORAL | 0 refills | Status: AC

## 2022-10-05 NOTE — ED Triage Notes (Signed)
Pt to ED from home for "boils under both arms x years" as stated by the patient. Pt advised they have been there for the last several years and she has not seen a doctor for same. Pt is CAOx4 and in no acute distress and ambulatory in triage.

## 2022-10-05 NOTE — ED Provider Notes (Signed)
[facility] Provider Note  Patient Contact: 4:03 PM (approximate)   History   Abscess (Boils under both arms)   HPI  Alexis Chandler is a 27 y.o. female presents to the emergency department with concern for possible folliculitis that is occurring bilaterally.  Patient has never been diagnosed with hidradenitis suppurativa but has required incision and drainage.  She has noticed some induration along her left axilla with this scant amount of spontaneous drainage.  She states that she does use near as opposed to shaving or waxing.  No nausea, vomiting or fever at home.  She states that she has never been diagnosed with MRSA.      Physical Exam   Triage Vital Signs: ED Triage Vitals [10/05/22 1508]  Enc Vitals Group     BP (!) 131/97     Pulse Rate 75     Resp 16     Temp 98 F (36.7 C)     Temp Source Oral     SpO2 98 %     Weight 245 lb (111.1 kg)     Height '5\' 5"'$  (1.651 m)     Head Circumference      Peak Flow      Pain Score 4     Pain Loc      Pain Edu?      Excl. in [place]?     Most recent vital signs: Vitals:   10/05/22 1508  BP: (!) 131/97  Pulse: 75  Resp: 16  Temp: 98 F (36.7 C)  SpO2: 98%     General: Alert and in no acute distress. Eyes:  PERRL. EOMI. Head: No acute traumatic findings ENT:      Nose: No congestion/rhinnorhea.      Mouth/Throat: Mucous membranes are moist. Neck: No stridor. No cervical spine tenderness to palpation. Cardiovascular:  Good peripheral perfusion Respiratory: Normal respiratory effort without tachypnea or retractions. Lungs CTAB. Good air entry to the bases with no decreased or absent breath sounds. Gastrointestinal: Bowel sounds 4 quadrants. Soft and nontender to palpation. No guarding or rigidity. No palpable masses. No distention. No CVA tenderness. Musculoskeletal: Full range of motion to all extremities.  Neurologic:  No gross focal neurologic deficits are appreciated.  Skin: Patient has  axillary folliculitis bilaterally with no palpable fluctuance to suggest indication for I&D    ED Results / Procedures / Treatments   Labs (all labs ordered are listed, but only abnormal results are displayed) Labs Reviewed - No data to display      PROCEDURES:  Critical Care performed: No  Procedures   MEDICATIONS ORDERED IN ED: Medications - No data to display   IMPRESSION / MDM / [place] / ED COURSE  I reviewed the triage vital signs and the nursing notes.                              Assessment and plan Axillary folliculitis 27 year old female presents to the emergency department with axillary folliculitis.  Recommended daily exfoliation and initiating Keflex.  Return precautions were given to return with new or worsening symptoms.  All patient questions were answered.      FINAL CLINICAL IMPRESSION(S) / ED DIAGNOSES   Final diagnoses:  Folliculitis     Rx / DC Orders   ED Discharge Orders          Ordered    cephALEXin (KEFLEX) 500 MG capsule  4 times  daily        10/05/22 1547             Note:  This document was prepared using Dragon voice recognition software and may include unintentional dictation errors.   Vallarie Mare [place], PA-C 10/05/22 1607    Harvest Dark, MD 10/05/22 [phone]

## 2022-10-05 NOTE — Discharge Instructions (Addendum)
Take Keflex four times daily for the next seven days.  

## 2022-10-15 ENCOUNTER — Ambulatory Visit
Admission: EM | Admit: 2022-10-15 | Discharge: 2022-10-15 | Payer: BLUE CROSS/BLUE SHIELD | Attending: Physician Assistant | Admitting: Physician Assistant

## 2022-10-15 DIAGNOSIS — R1084 Generalized abdominal pain: Secondary | ICD-10-CM

## 2022-10-15 DIAGNOSIS — R112 Nausea with vomiting, unspecified: Secondary | ICD-10-CM | POA: Diagnosis present

## 2022-10-15 LAB — CBC WITH DIFFERENTIAL/PLATELET
Abs Immature Granulocytes: 0.02 10*3/uL (ref 0.00–0.07)
Basophils Absolute: 0 10*3/uL (ref 0.0–0.1)
Basophils Relative: 0 %
Eosinophils Absolute: 0.1 10*3/uL (ref 0.0–0.5)
Eosinophils Relative: 1 %
HCT: 38.6 % (ref 36.0–46.0)
Hemoglobin: 12.8 g/dL (ref 12.0–15.0)
Immature Granulocytes: 0 %
Lymphocytes Relative: 37 %
Lymphs Abs: 3 10*3/uL (ref 0.7–4.0)
MCH: 27 pg (ref 26.0–34.0)
MCHC: 33.2 g/dL (ref 30.0–36.0)
MCV: 81.4 fL (ref 80.0–100.0)
Monocytes Absolute: 0.5 10*3/uL (ref 0.1–1.0)
Monocytes Relative: 6 %
Neutro Abs: 4.4 10*3/uL (ref 1.7–7.7)
Neutrophils Relative %: 56 %
Platelets: 386 10*3/uL (ref 150–400)
RBC: 4.74 MIL/uL (ref 3.87–5.11)
RDW: 14.2 % (ref 11.5–15.5)
WBC: 8 10*3/uL (ref 4.0–10.5)
nRBC: 0 % (ref 0.0–0.2)

## 2022-10-15 LAB — COMPREHENSIVE METABOLIC PANEL
ALT: 25 U/L (ref 0–44)
AST: 19 U/L (ref 15–41)
Albumin: 3.8 g/dL (ref 3.5–5.0)
Alkaline Phosphatase: 103 U/L (ref 38–126)
Anion gap: 6 (ref 5–15)
BUN: 12 mg/dL (ref 6–20)
CO2: 25 mmol/L (ref 22–32)
Calcium: 8.8 mg/dL — ABNORMAL LOW (ref 8.9–10.3)
Chloride: 105 mmol/L (ref 98–111)
Creatinine, Ser: 0.63 mg/dL (ref 0.44–1.00)
GFR, Estimated: >60 mL/min (ref >60)
Glucose, Bld: 88 mg/dL (ref 70–99)
Potassium: 4 mmol/L (ref 3.5–5.1)
Sodium: 136 mmol/L (ref 135–145)
Total Bilirubin: 0.5 mg/dL (ref 0.3–1.2)
Total Protein: 7.9 g/dL (ref 6.5–8.1)

## 2022-10-15 LAB — URINALYSIS, ROUTINE W REFLEX MICROSCOPIC
Bilirubin Urine: NEGATIVE
Glucose, UA: NEGATIVE mg/dL
Ketones, ur: NEGATIVE mg/dL
Leukocytes,Ua: NEGATIVE
Nitrite: NEGATIVE
Protein, ur: NEGATIVE mg/dL
Specific Gravity, Urine: 1.02 (ref 1.005–1.030)
pH: 7 (ref 5.0–8.0)

## 2022-10-15 LAB — PREGNANCY, URINE: Preg Test, Ur: NEGATIVE

## 2022-10-15 LAB — URINALYSIS, MICROSCOPIC (REFLEX): WBC, UA: NONE SEEN WBC/hpf (ref 0–5)

## 2022-10-15 LAB — LIPASE, BLOOD: Lipase: 33 U/L (ref 11–51)

## 2022-10-15 MED ORDER — ONDANSETRON 8 MG PO TBDP
8.0000 mg | ORAL_TABLET | Freq: Once | ORAL | Status: AC
  Administered 2022-10-15: 8 mg via ORAL

## 2022-10-15 NOTE — ED Provider Notes (Signed)
[facility]    CSN: [id number] Arrival date & time: 10/15/22  1014      History   Chief Complaint Chief Complaint  Patient presents with   Nausea   Emesis   Abdominal Pain    HPI Alexis Chandler is a 27 y.o. female.   Patient presents today with a 24-hour history of GI symptoms.  Reports that yesterday she developed nausea and vomiting to the point that she has had decreased oral intake.  She last attempted to drink something yesterday evening and felt incredibly nauseous but did not have recurrent vomiting.  She denies any associated diarrhea and reports that her last bowel movement was yesterday and was normal.  Denies any associated fever, chest pain, shortness of breath, cough, congestion.  She does report associated abdominal pain that is rated 5 on a 0-10 pain scale, described as cramping, present throughout her abdomen but worse in the epigastrium, no alleviating factors identified.  She has not tried any over-the-counter medication for symptom management.  Denies any history of GI condition including pancreatitis, GERD, ulcerative colitis, Crohn's disease.  Denies any known sick contacts.  Denies any recent travel, suspicious food intake, antibiotic use, medication changes.  Denies changes to diet or increase in alcohol consumption.  Denies previous abdominal surgery and still has gallbladder and appendix.    Past Medical History:  Diagnosis Date   Migraine    Pseudotumor cerebri     Patient Active Problem List   Diagnosis Date Noted   Encounter for well woman exam with routine gynecological exam 04/30/2022   History of cesarean delivery 04/30/2022   History of cervical incompetence 04/30/2022   History of preterm delivery 04/30/2022   Pseudotumor cerebri syndrome 04/30/2022   Oligomenorrhea 04/30/2022   Acute suppurative otitis media of both ears without spontaneous rupture of tympanic membranes 09/14/2020   BMI 40.0-44.9, adult ([place]) 05/01/2020    Nonintractable episodic headache 03/24/2020   Finger injury, right, initial encounter 12/19/2017   ST (sore throat) 11/07/2017   Dry skin dermatitis 10/23/2017    Past Surgical History:  Procedure Laterality Date   CESAREAN SECTION     IR FL GUIDED LOC OF NEEDLE/CATH TIP FOR SPINAL INJECTION LT  10/22/2021    OB History   No obstetric history on file.      Home Medications    Prior to Admission medications   Medication Sig Start Date End Date Taking? Authorizing Provider  acetaZOLAMIDE ER (DIAMOX) 500 MG capsule Take 1 capsule (500 mg total) by mouth 2 (two) times daily. 10/21/21   Harvest Dark, MD  butalbital-acetaminophen-caffeine (FIORICET) [phone] MG tablet Take 1 tablet by mouth every 6 (six) hours as needed for headache. 03/13/22 03/13/23  Cuthriell, Charline Bills, PA-C  cetirizine (ZYRTEC ALLERGY) 10 MG tablet Take 1 tablet (10 mg total) by mouth daily. 09/30/21 09/30/22  Ward, Delice Bison, DO  fluticasone (FLONASE) 50 MCG/ACT nasal spray Place 1 spray into both nostrils daily. 09/30/21 09/30/22  Ward, Delice Bison, DO    Family History History reviewed. No pertinent family history.  Social History Social History   Tobacco Use   Smoking status: Never   Smokeless tobacco: Never  Substance Use Topics   Alcohol use: Never   Drug use: Never     Allergies   Shellfish allergy   Review of Systems Review of Systems  Constitutional:  Positive for activity change. Negative for appetite change, fatigue and fever.  Respiratory:  Negative for cough and shortness of breath.  Cardiovascular:  Negative for chest pain.  Gastrointestinal:  Positive for abdominal pain, nausea and vomiting. Negative for constipation and diarrhea.  Genitourinary:  Negative for dysuria, frequency and urgency.  Neurological:  Negative for dizziness, light-headedness and headaches.     Physical Exam Triage Vital Signs ED Triage Vitals  Enc Vitals Group     BP 10/15/22 1043 127/73     Pulse Rate  10/15/22 1043 74     Resp 10/15/22 1043 16     Temp 10/15/22 1043 98.1 F (36.7 C)     Temp Source 10/15/22 1043 Oral     SpO2 10/15/22 1043 95 %     Weight 10/15/22 1042 260 lb (117.9 kg)     Height 10/15/22 1042 5\' 5"  (1.651 m)     Head Circumference --      Peak Flow --      Pain Score 10/15/22 1042 5     Pain Loc --      Pain Edu? --      Excl. in [place]? --    No data found.  Updated Vital Signs BP 127/73 (BP Location: Left Arm)   Pulse 74   Temp 98.1 F (36.7 C) (Oral)   Resp 16   Ht 5\' 5"  (1.651 m)   Wt 260 lb (117.9 kg)   SpO2 95%   BMI 43.27 kg/m   Visual Acuity Right Eye Distance:   Left Eye Distance:   Bilateral Distance:    Right Eye Near:   Left Eye Near:    Bilateral Near:     Physical Exam Vitals reviewed.  Constitutional:      General: She is awake. She is not in acute distress.    Appearance: Normal appearance. She is well-developed. She is not ill-appearing.     Comments: Very pleasant female appears stated age in no acute distress sitting comfortably in exam room  HENT:     Head: Normocephalic and atraumatic.     Mouth/Throat:     Mouth: Mucous membranes are moist.  Cardiovascular:     Rate and Rhythm: Normal rate and regular rhythm.     Heart sounds: Normal heart sounds, S1 normal and S2 normal. No murmur heard. Pulmonary:     Effort: Pulmonary effort is normal.     Breath sounds: Normal breath sounds. No wheezing, rhonchi or rales.     Comments: Clear to auscultation bilaterally Abdominal:     General: Bowel sounds are normal.     Palpations: Abdomen is soft.     Tenderness: There is generalized abdominal tenderness and tenderness in the epigastric area. There is no right CVA tenderness, left CVA tenderness, guarding or rebound. Negative signs include Murphy's sign, Rovsing's sign, McBurney's sign and psoas sign.     Comments: Tenderness palpation throughout abdomen but worse in epigastrium.  Negative Murphy sign.  No evidence of acute  abdomen on physical exam.  Psychiatric:        Behavior: Behavior is cooperative.      UC Treatments / Results  Labs (all labs ordered are listed, but only abnormal results are displayed) Labs Reviewed  URINALYSIS, ROUTINE W REFLEX MICROSCOPIC - Abnormal; Notable for the following components:      Result Value   Hgb urine dipstick TRACE (*)    All other components within normal limits  COMPREHENSIVE METABOLIC PANEL - Abnormal; Notable for the following components:   Calcium 8.8 (*)    All other components within normal limits  URINALYSIS, MICROSCOPIC (  REFLEX) - Abnormal; Notable for the following components:   Bacteria, UA RARE (*)    All other components within normal limits  URINE CULTURE  PREGNANCY, URINE  LIPASE, BLOOD  CBC WITH DIFFERENTIAL/PLATELET    EKG   Radiology No results found.  Procedures Procedures (including critical care time)  Medications Ordered in UC Medications  ondansetron (ZOFRAN-ODT) disintegrating tablet 8 mg (8 mg Oral Given 10/15/22 1103)    Initial Impression / Assessment and Plan / UC Course  I have reviewed the triage vital signs and the nursing notes.  Pertinent labs & imaging results that were available during my care of the patient were reviewed by me and considered in my medical decision making (see chart for details).     Patient is well-appearing, afebrile, nontoxic, nontachycardic.  Vital signs and physical exam are reassuring today.  Lipase, CBC, CMP were essentially normal.  Urine pregnancy was negative.  UA had trace blood with a few bacteria.  Will send this for culture but defer antibiotics.  Patient was given 8 mg of Zofran in clinic but had persistent nausea and vomiting was unable to pass oral challenge.  Discussed that given she has intractable symptoms I recommended that she go to the emergency room for further evaluation and management.  She is agreeable and will go directly to [facility].  She was  stable at the time of discharge and will be transported by private vehicle.  Final Clinical Impressions(s) / UC Diagnoses   Final diagnoses:  Intractable nausea and vomiting  Generalized abdominal pain     Discharge Instructions      Please go to the emergency room for further evaluation and management since you are unable to eat or drink after Zofran.     ED Prescriptions   None    PDMP not reviewed this encounter.   Terrilee Croak, PA-C 10/15/22 1157

## 2022-10-15 NOTE — ED Triage Notes (Signed)
Pt c/o upper abd pain & N/V x1 day, unable to keep any food or fluids down. No episodes today. Denies any diarrhea or fevers.

## 2022-10-15 NOTE — Discharge Instructions (Addendum)
Please go to the emergency room for further evaluation and management since you are unable to eat or drink after Zofran.

## 2022-10-15 NOTE — ED Notes (Signed)
Patient is being discharged from the Urgent Care and sent to the Emergency Department via POV . Per Verna Czech PA, patient is in need of higher level of care due to Intractable nausea & vomiting. Patient is aware and verbalizes understanding of plan of care.  Vitals:   10/15/22 1043  BP: 127/73  Pulse: 74  Resp: 16  Temp: 98.1 F (36.7 C)  SpO2: 95%

## 2022-10-16 LAB — URINE CULTURE: Culture: <10000 — AB

## 2022-11-05 DIAGNOSIS — R200 Anesthesia of skin: Secondary | ICD-10-CM | POA: Insufficient documentation

## 2022-11-05 DIAGNOSIS — Z5321 Procedure and treatment not carried out due to patient leaving prior to being seen by health care provider: Secondary | ICD-10-CM | POA: Diagnosis not present

## 2022-11-05 LAB — CBC WITH DIFFERENTIAL/PLATELET
Abs Immature Granulocytes: 0.03 10*3/uL (ref 0.00–0.07)
Basophils Absolute: 0.1 10*3/uL (ref 0.0–0.1)
Basophils Relative: 1 %
Eosinophils Absolute: 0.1 10*3/uL (ref 0.0–0.5)
Eosinophils Relative: 1 %
HCT: 38.3 % (ref 36.0–46.0)
Hemoglobin: 12.1 g/dL (ref 12.0–15.0)
Immature Granulocytes: 0 %
Lymphocytes Relative: 46 %
Lymphs Abs: 5.3 10*3/uL — ABNORMAL HIGH (ref 0.7–4.0)
MCH: 26.7 pg (ref 26.0–34.0)
MCHC: 31.6 g/dL (ref 30.0–36.0)
MCV: 84.5 fL (ref 80.0–100.0)
Monocytes Absolute: 0.6 10*3/uL (ref 0.1–1.0)
Monocytes Relative: 5 %
Neutro Abs: 5.3 10*3/uL (ref 1.7–7.7)
Neutrophils Relative %: 47 %
Platelets: 344 10*3/uL (ref 150–400)
RBC: 4.53 MIL/uL (ref 3.87–5.11)
RDW: 14.4 % (ref 11.5–15.5)
WBC: 11.4 10*3/uL — ABNORMAL HIGH (ref 4.0–10.5)
nRBC: 0 % (ref 0.0–0.2)

## 2022-11-05 NOTE — ED Triage Notes (Addendum)
Pt reports bilateral hand numbness that started earlier today. Pt denies headache or other accompanying symptoms. Denies injury, uses hands a lot at work.

## 2022-11-06 ENCOUNTER — Other Ambulatory Visit: Payer: Self-pay

## 2022-11-06 ENCOUNTER — Encounter (HOSPITAL_COMMUNITY): Payer: Self-pay

## 2022-11-06 ENCOUNTER — Emergency Department
Admission: EM | Admit: 2022-11-06 | Discharge: 2022-11-06 | Discharge status: Against Medical Advice | Payer: BLUE CROSS/BLUE SHIELD | Attending: Emergency Medicine | Admitting: Emergency Medicine

## 2022-11-06 ENCOUNTER — Emergency Department (HOSPITAL_COMMUNITY)
Admission: EM | Admit: 2022-11-06 | Discharge: 2022-11-06 | Discharge status: Against Medical Advice | Payer: BLUE CROSS/BLUE SHIELD | Attending: Student | Admitting: Student

## 2022-11-06 ENCOUNTER — Emergency Department (HOSPITAL_COMMUNITY): Payer: BLUE CROSS/BLUE SHIELD

## 2022-11-06 DIAGNOSIS — Z5321 Procedure and treatment not carried out due to patient leaving prior to being seen by health care provider: Secondary | ICD-10-CM | POA: Diagnosis not present

## 2022-11-06 DIAGNOSIS — R200 Anesthesia of skin: Secondary | ICD-10-CM | POA: Insufficient documentation

## 2022-11-06 LAB — BASIC METABOLIC PANEL
Anion gap: 10 (ref 5–15)
BUN: 16 mg/dL (ref 6–20)
CO2: 19 mmol/L — ABNORMAL LOW (ref 22–32)
Calcium: 9.1 mg/dL (ref 8.9–10.3)
Chloride: 107 mmol/L (ref 98–111)
Creatinine, Ser: 0.81 mg/dL (ref 0.44–1.00)
GFR, Estimated: >60 mL/min (ref >60)
Glucose, Bld: 89 mg/dL (ref 70–99)
Potassium: 3.9 mmol/L (ref 3.5–5.1)
Sodium: 136 mmol/L (ref 135–145)

## 2022-11-06 NOTE — ED Provider Triage Note (Signed)
Emergency Medicine Provider Triage Evaluation Note  Alexis Chandler , a 27 y.o. female  was evaluated in triage.  Pt complains of bilateral hand numbness.  Started 2 days ago, worse to the right than the left.  She feels it mostly on the palmar side of her hands, but it is circumferential with radiation up to the arms.  No recent falls or injuries, no fevers or chills, no recent viral illness, no lower extremity tingling or numbness..  Review of Systems  Per HPI  Physical Exam  There were no vitals taken for this visit. Gen:   Awake, no distress   Resp:  Normal effort  MSK:   Moves extremities without difficulty  Other:  Radial pulse 2+ bilaterally, cap refill less than 2.  Upper extremity grip strength symmetric bilaterally, bilateral numbness on exam to the palmar aspect.  Negative Tinel and phalen  Medical Decision Making  Medically screening exam initiated at 12:55 PM.  Appropriate orders placed.  Mitra Nasrallah was informed that the remainder of the evaluation will be completed by another provider, this initial triage assessment does not replace that evaluation, and the importance of remaining in the ED until their evaluation is complete.     Theron Arista, PA-C 11/06/22 1256

## 2022-11-06 NOTE — ED Notes (Signed)
No answer when called several times from lobby 

## 2022-11-06 NOTE — ED Triage Notes (Signed)
Pt came in via POV d/t numbness in both her hands for the past 2 days, A/Ox4. States the numbness is mainly Palm side & will radiate to the top of her hands sometimes.

## 2022-11-08 ENCOUNTER — Emergency Department
Admission: EM | Admit: 2022-11-08 | Discharge: 2022-11-08 | Disposition: A | Discharge status: Home / Self Care | Payer: BLUE CROSS/BLUE SHIELD | Attending: Emergency Medicine | Admitting: Emergency Medicine

## 2022-11-08 ENCOUNTER — Encounter: Payer: Self-pay | Admitting: Intensive Care

## 2022-11-08 ENCOUNTER — Other Ambulatory Visit: Payer: Self-pay

## 2022-11-08 DIAGNOSIS — R200 Anesthesia of skin: Secondary | ICD-10-CM | POA: Diagnosis present

## 2022-11-08 DIAGNOSIS — G5603 Carpal tunnel syndrome, bilateral upper limbs: Secondary | ICD-10-CM

## 2022-11-08 LAB — VITAMIN B12: Vitamin B-12: 235 pg/mL (ref 180–914)

## 2022-11-08 LAB — BASIC METABOLIC PANEL
Anion gap: 7 (ref 5–15)
BUN: 16 mg/dL (ref 6–20)
CO2: 24 mmol/L (ref 22–32)
Calcium: 8.9 mg/dL (ref 8.9–10.3)
Chloride: 107 mmol/L (ref 98–111)
Creatinine, Ser: 0.65 mg/dL (ref 0.44–1.00)
GFR, Estimated: >60 mL/min (ref >60)
Glucose, Bld: 90 mg/dL (ref 70–99)
Potassium: 3.6 mmol/L (ref 3.5–5.1)
Sodium: 138 mmol/L (ref 135–145)

## 2022-11-08 LAB — TSH: TSH: 0.484 u[IU]/mL (ref 0.350–4.500)

## 2022-11-08 LAB — CK: Total CK: 139 U/L (ref 38–234)

## 2022-11-08 LAB — MAGNESIUM: Magnesium: 2.1 mg/dL (ref 1.7–2.4)

## 2022-11-08 LAB — POC URINE PREG, ED: Preg Test, Ur: NEGATIVE

## 2022-11-08 NOTE — ED Triage Notes (Signed)
Patient presents with numbness/tingling in bilateral hands X4 days. Reports she awoke one morning with symptoms.  States her hands do get numbness and tingling, when holding phone or working,  but usually resolves.

## 2022-11-08 NOTE — ED Provider Notes (Signed)
[facility] Provider Note    Event Date/Time   First MD Initiated Contact with Patient 11/08/22 1131     (approximate)   History   Numbness   HPI  Alexis Chandler is a 27 y.o. female   Past medical history of migraine headaches and pseudotumor cerebri who presents to the emergency department with bilateral volar aspect of palm to fingertips tingling sensation.  No pain.  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  Onset about 1 week ago and constant unchanging.  Any other motor or sensory changes elsewhere or traumatic injuries.  She denies any other acute medical complaints.   External Medical Documents Reviewed: CT scan from August 2023 with evidence of increased intracranial pressure      Physical Exam   Triage Vital Signs: ED Triage Vitals  Enc Vitals Group     BP 11/08/22 0935 138/80     Pulse Rate 11/08/22 0935 86     Resp 11/08/22 0935 18     Temp 11/08/22 0935 98.3 F (36.8 C)     Temp Source 11/08/22 0935 Oral     SpO2 11/08/22 0935 95 %     Weight 11/08/22 0941 230 lb (104.3 kg)     Height 11/08/22 0941 5\' 5"  (1.651 m)     Head Circumference --      Peak Flow --      Pain Score 11/08/22 0941 0     Pain Loc --      Pain Edu? --      Excl. in GC? --     Most recent vital signs: Vitals:   11/08/22 0935  BP: 138/80  Pulse: 86  Resp: 18  Temp: 98.3 F (36.8 C)  SpO2: 95%    General: Awake, no distress.  CV:  Good peripheral perfusion.  Resp:  Normal effort.  Abd:  No distention.  Other:  Subjective tingling sensations of palms and fingertips of all extremities bilaterally. Phalen and  Tinel test is negative.  Motor exam fully intact.  Neurovascular intact and brisk cap refill.  No other focal neurologic deficits.  Alert and oriented comfortable appearing.   ED Results / Procedures / Treatments   Labs (all labs ordered are listed, but only abnormal results are  displayed) Labs Reviewed  TSH  BASIC METABOLIC PANEL  MAGNESIUM  CK  VITAMIN B12  POC URINE PREG, ED     I ordered and reviewed the above labs they are notable for normal electrolytes and cell counts, normal CK  PROCEDURES:  Critical Care performed: No  Procedures   MEDICATIONS ORDERED IN ED: Medications - No data to display IMPRESSION / MDM / ASSESSMENT AND PLAN / ED COURSE  I reviewed the triage vital signs and the nursing notes.                                Patient's presentation is most consistent with acute complicated illness / injury requiring diagnostic workup.  Differential diagnosis includes, but is not limited to, carpal tunnel syndrome, peripheral neuropathy, considered but less likely stroke  The patient is on the cardiac monitor to evaluate for evidence of arrhythmia and/or significant heart rate changes.  MDM: Consistent with carpal tunnel syndrome with repetitive hand movements and distribution consistent with median nerve distribution at the wrist distally.  No other focal neurologic deficits to suggest stroke.  Unlikely  due to her previous pseudotumor cerebri diagnosis.  Wrist braces and follow-up with PMD, return with any new or worsening symptoms.         FINAL CLINICAL IMPRESSION(S) / ED DIAGNOSES   Final diagnoses:  Bilateral carpal tunnel syndrome     Rx / DC Orders   ED Discharge Orders          Ordered    Ambulatory Referral to Primary Care (Establish Care)        11/08/22 1414             Note:  This document was prepared using Dragon voice recognition software and may include unintentional dictation errors.    Pilar Jarvis, MD 11/08/22 1425

## 2022-11-08 NOTE — Discharge Instructions (Addendum)
Use your wrist braces.    Follow-up with your doctor in the next 2 weeks to reassess your symptoms.  There was 1 more lab test called a vitamin B12 that is still pending and the result at the time of your discharge, please follow-up with your doctor to check the results of this test.  Thank you for choosing [place] for your health care today!  Please see your primary doctor this week for a follow up appointment.   Sometimes, in the early stages of certain disease courses it is difficult to detect in the emergency department evaluation -- so, it is important that you continue to monitor your symptoms and call your doctor right away or return to the emergency department if you develop any new or worsening symptoms.  Please go to the following website to schedule new (and existing) patient appointments:   [email or web address]  If you do not have a primary doctor try calling the following clinics to establish care:  If you have insurance:  [facility] [phone] [address] [place]., [place] [address]   [facility]  [phone] [address] [place]., [place] [address]   If you do not have insurance:  [facility]  [phone] [address], [place] [address]   The following is another list of primary care offices in the area who are accepting new patients at this time.  Please reach out to one of them directly and let them know you would like to schedule an appointment to follow up on an Emergency Department visit, and/or to establish a new primary care provider (PCP).  There are likely other primary care clinics in the are who are accepting new patients, but this is an excellent place to start:  [facility] Lead physician: Dr Shirlee Latch [address] [place], [place] [address] [phone]  [facility] Lead Physician: Dr Alba Cory [address],  [place], [place] [address] [phone]  [facility]  Lead Physician: Dr Olevia Perches [address] [place], [place] [address] [phone]  [facility] Lead Physician: Dr Sofie Hartigan [address] [place], [place], [place] [address] [phone]  [facility] Lead Physician: Dr Bari Edward [address] [place], [place] [address] [phone]   It was my pleasure to care for you today.   Daneil Dan Modesto Charon, MD

## 2023-03-11 ENCOUNTER — Ambulatory Visit
Admission: EM | Admit: 2023-03-11 | Discharge: 2023-03-11 | Disposition: A | Discharge status: Home / Self Care | Payer: BLUE CROSS/BLUE SHIELD | Attending: Emergency Medicine | Admitting: Emergency Medicine

## 2023-03-11 DIAGNOSIS — H6992 Unspecified Eustachian tube disorder, left ear: Secondary | ICD-10-CM | POA: Diagnosis not present

## 2023-03-11 MED ORDER — IBUPROFEN 800 MG PO TABS: 800.0000 mg | ORAL_TABLET | Freq: Three times a day (TID) | ORAL | 0 refills | Status: DC

## 2023-03-11 MED ORDER — IPRATROPIUM BROMIDE 0.03 % NA SOLN: 2.0000 | Freq: Two times a day (BID) | NASAL | 12 refills | Status: AC

## 2023-03-11 MED ORDER — LIDOCAINE VISCOUS HCL 2 % MT SOLN: 15.0000 mL | OROMUCOSAL | 0 refills | Status: DC | PRN

## 2023-03-11 NOTE — Discharge Instructions (Addendum)
Today you are evaluated for your pain, on exam there is no redness or drainage within the ear canal that would indicate infection and there is no redness or Lenoard Helbert patches to your throat that would indicate infection and therefore I believe your symptoms today are most likely related to your congestion  Use nasal spray every morning and every evening to help clear out the sinuses which ideally will release pressure from the ears and minimize pain  You may gargle and spit lidocaine solution every 4 hours as needed to provide temporary relief to your throat  May take ibuprofen every 8 hours in addition to Tylenol as needed for comfort  You may hold warm compresses to the outer ear as needed in 10 to 15-minute intervals  You may take over-the-counter medicine such as Mucinex, pseudoephedrine, Claritin or Zyrtec to further help reduce congestion  May attempt salt water gargles, throat lozenges, warm liquids and soft foods to further help soothe the throat  If you feel that symptoms are not improving or at any point your symptoms worsen you may follow-up in urgent care for reevaluation

## 2023-03-11 NOTE — ED Provider Notes (Signed)
[facility]    CSN: [id number] Arrival date & time: 03/11/23  8295      History   Chief Complaint Chief Complaint  Patient presents with   Otalgia    HPI Alexis Chandler is a 27 y.o. female.   Patient presents for evaluation of nasal congestion, left-sided ear pain and sore throat beginning overnight.  Ear pain radiating down the left side of the neck.  Sore throat causing pain with swallowing making it difficult to eat and drink but able to do so.  No known sick contacts.  Has not attempted treatment.  Denies presence of fever chills body aches, cough.  Denies ear drainage, pruritus or decreased hearing.,   Past Medical History:  Diagnosis Date   Migraine    Pseudotumor cerebri     Patient Active Problem List   Diagnosis Date Noted   Encounter for well woman exam with routine gynecological exam 04/30/2022   History of cesarean delivery 04/30/2022   History of cervical incompetence 04/30/2022   History of preterm delivery 04/30/2022   Pseudotumor cerebri syndrome 04/30/2022   Oligomenorrhea 04/30/2022   Acute suppurative otitis media of both ears without spontaneous rupture of tympanic membranes 09/14/2020   BMI 40.0-44.9, adult (HCC) 05/01/2020   Nonintractable episodic headache 03/24/2020   Finger injury, right, initial encounter 12/19/2017   ST (sore throat) 11/07/2017   Dry skin dermatitis 10/23/2017    Past Surgical History:  Procedure Laterality Date   CESAREAN SECTION     IR FL GUIDED LOC OF NEEDLE/CATH TIP FOR SPINAL INJECTION LT  10/22/2021    OB History   No obstetric history on file.      Home Medications    Prior to Admission medications   Medication Sig Start Date End Date Taking? Authorizing Provider  acetaZOLAMIDE ER (DIAMOX) 500 MG capsule Take 1 capsule (500 mg total) by mouth 2 (two) times daily. 10/21/21   Minna Antis, MD  butalbital-acetaminophen-caffeine (FIORICET) [phone] MG tablet Take 1 tablet by mouth every 6 (six)  hours as needed for headache. 03/13/22 03/13/23  Cuthriell, Delorise Royals, PA-C  cetirizine (ZYRTEC ALLERGY) 10 MG tablet Take 1 tablet (10 mg total) by mouth daily. 09/30/21 09/30/22  Ward, Layla Maw, DO  fluticasone (FLONASE) 50 MCG/ACT nasal spray Place 1 spray into both nostrils daily. 09/30/21 09/30/22  Ward, Layla Maw, DO    Family History History reviewed. No pertinent family history.  Social History Social History   Tobacco Use   Smoking status: Never   Smokeless tobacco: Never  Vaping Use   Vaping status: Never Used  Substance Use Topics   Alcohol use: Never   Drug use: Never     Allergies   Shellfish allergy   Review of Systems Review of Systems  Constitutional: Negative.   HENT:  Positive for congestion, ear pain and sore throat. Negative for dental problem, drooling, ear discharge, facial swelling, hearing loss, mouth sores, nosebleeds, postnasal drip, rhinorrhea, sinus pressure, sinus pain, sneezing, tinnitus, trouble swallowing and voice change.   Respiratory: Negative.    Cardiovascular: Negative.   Gastrointestinal: Negative.   Skin: Negative.   Neurological: Negative.      Physical Exam Triage Vital Signs ED Triage Vitals  Encounter Vitals Group     BP 03/11/23 0939 (!) 149/85     Systolic BP Percentile --      Diastolic BP Percentile --      Pulse Rate 03/11/23 0939 73     Resp 03/11/23 0939 16  Temp 03/11/23 0939 98.3 F (36.8 C)     Temp Source 03/11/23 0939 Oral     SpO2 03/11/23 0939 97 %     Weight 03/11/23 0938 230 lb (104.3 kg)     Height --      Head Circumference --      Peak Flow --      Pain Score 03/11/23 0938 7     Pain Loc --      Pain Education --      Exclude from Growth Chart --    No data found.  Updated Vital Signs BP (!) 149/85 (BP Location: Right Arm)   Pulse 73   Temp 98.3 F (36.8 C) (Oral)   Resp 16   Wt 230 lb (104.3 kg)   LMP 03/07/2023 (Exact Date)   SpO2 97%   BMI 38.27 kg/m   Visual Acuity Right Eye  Distance:   Left Eye Distance:   Bilateral Distance:    Right Eye Near:   Left Eye Near:    Bilateral Near:     Physical Exam Constitutional:      Appearance: Normal appearance.  HENT:     Right Ear: Tympanic membrane, ear canal and external ear normal.     Left Ear: Tympanic membrane, ear canal and external ear normal.     Nose: Congestion present. No rhinorrhea.     Mouth/Throat:     Mouth: Mucous membranes are moist.     Pharynx: No posterior oropharyngeal erythema.  Eyes:     Extraocular Movements: Extraocular movements intact.  Pulmonary:     Effort: Pulmonary effort is normal.  Neurological:     Mental Status: She is alert and oriented to person, place, and time. Mental status is at baseline.      UC Treatments / Results  Labs (all labs ordered are listed, but only abnormal results are displayed) Labs Reviewed - No data to display  EKG   Radiology No results found.  Procedures Procedures (including critical care time)  Medications Ordered in UC Medications - No data to display  Initial Impression / Assessment and Plan / UC Course  I have reviewed the triage vital signs and the nursing notes.  Pertinent labs & imaging results that were available during my care of the patient were reviewed by me and considered in my medical decision making (see chart for details).  Eustachian tube dysfunction, left  Congestion noted to the nasal turbinates, otherwise stable exam, no erythema or drainage noted to the tympanic membrane or ear canal, no erythema or exudate noted to the oropharynx therefore strep testing deferred, symptoms are most likely result of congestion, discussed this with patient, prescribed Atrovent nasal spray, ibuprofen 800 mg and viscous lidocaine for supportive care, recommended decongestants, antihistamines and mucolytic's, recommended warm compresses to the external ear, advised consistent use of medications for most effective results, advised  follow-up if symptoms persist or at any point if symptoms worsen   Final Clinical Impressions(s) / UC Diagnoses   Final diagnoses:  None   Discharge Instructions   None    ED Prescriptions   None    PDMP not reviewed this encounter.   Valinda Hoar, [place] 03/11/23 [phone]

## 2023-03-11 NOTE — ED Triage Notes (Signed)
Left side ear pain. Started last night. Pain is behind and down her neck. Pain 7/10

## 2023-04-24 ENCOUNTER — Other Ambulatory Visit: Payer: Self-pay

## 2023-04-24 ENCOUNTER — Emergency Department: Payer: BLUE CROSS/BLUE SHIELD

## 2023-04-24 ENCOUNTER — Encounter: Payer: Self-pay | Admitting: Emergency Medicine

## 2023-04-24 DIAGNOSIS — R42 Dizziness and giddiness: Secondary | ICD-10-CM | POA: Diagnosis present

## 2023-04-24 LAB — URINALYSIS, ROUTINE W REFLEX MICROSCOPIC
Bacteria, UA: NONE SEEN
Bilirubin Urine: NEGATIVE
Glucose, UA: NEGATIVE mg/dL
Ketones, ur: NEGATIVE mg/dL
Leukocytes,Ua: NEGATIVE
Nitrite: NEGATIVE
Protein, ur: NEGATIVE mg/dL
Specific Gravity, Urine: 1.027 (ref 1.005–1.030)
pH: 5 (ref 5.0–8.0)

## 2023-04-24 LAB — CBC WITH DIFFERENTIAL/PLATELET
Abs Immature Granulocytes: 0.02 10*3/uL (ref 0.00–0.07)
Basophils Absolute: 0 10*3/uL (ref 0.0–0.1)
Basophils Relative: 0 %
Eosinophils Absolute: 0.1 10*3/uL (ref 0.0–0.5)
Eosinophils Relative: 1 %
HCT: 37.8 % (ref 36.0–46.0)
Hemoglobin: 11.9 g/dL — ABNORMAL LOW (ref 12.0–15.0)
Immature Granulocytes: 0 %
Lymphocytes Relative: 37 %
Lymphs Abs: 3.8 10*3/uL (ref 0.7–4.0)
MCH: 26.3 pg (ref 26.0–34.0)
MCHC: 31.5 g/dL (ref 30.0–36.0)
MCV: 83.4 fL (ref 80.0–100.0)
Monocytes Absolute: 0.5 10*3/uL (ref 0.1–1.0)
Monocytes Relative: 5 %
Neutro Abs: 5.8 10*3/uL (ref 1.7–7.7)
Neutrophils Relative %: 57 %
Platelets: 352 10*3/uL (ref 150–400)
RBC: 4.53 MIL/uL (ref 3.87–5.11)
RDW: 14.4 % (ref 11.5–15.5)
WBC: 10.2 10*3/uL (ref 4.0–10.5)
nRBC: 0 % (ref 0.0–0.2)

## 2023-04-24 LAB — POC URINE PREG, ED: Preg Test, Ur: NEGATIVE

## 2023-04-24 NOTE — ED Triage Notes (Signed)
Pt presents ambulatory to triage via POV with complaints of dizziness since 1700 tonight. Pt endorses some pressure in her head and has a hx of pseudotumor cerebri syndrome. No meds taken PTA. A&Ox4 at this time. Denies vision changes, N/V/, CP or SOB.

## 2023-04-25 ENCOUNTER — Emergency Department
Admission: EM | Admit: 2023-04-25 | Discharge: 2023-04-25 | Disposition: A | Discharge status: Home / Self Care | Payer: BLUE CROSS/BLUE SHIELD | Attending: Emergency Medicine | Admitting: Emergency Medicine

## 2023-04-25 DIAGNOSIS — R42 Dizziness and giddiness: Secondary | ICD-10-CM

## 2023-04-25 LAB — TROPONIN I (HIGH SENSITIVITY)
Troponin I (High Sensitivity): <2 ng/L (ref <18)
Troponin I (High Sensitivity): <2 ng/L (ref <18)

## 2023-04-25 LAB — COMPREHENSIVE METABOLIC PANEL
ALT: 28 U/L (ref 0–44)
AST: 18 U/L (ref 15–41)
Albumin: 3.7 g/dL (ref 3.5–5.0)
Alkaline Phosphatase: 93 U/L (ref 38–126)
Anion gap: 11 (ref 5–15)
BUN: 13 mg/dL (ref 6–20)
CO2: 25 mmol/L (ref 22–32)
Calcium: 9.2 mg/dL (ref 8.9–10.3)
Chloride: 102 mmol/L (ref 98–111)
Creatinine, Ser: 0.87 mg/dL (ref 0.44–1.00)
GFR, Estimated: >60 mL/min (ref >60)
Glucose, Bld: 98 mg/dL (ref 70–99)
Potassium: 3.6 mmol/L (ref 3.5–5.1)
Sodium: 138 mmol/L (ref 135–145)
Total Bilirubin: 0.4 mg/dL (ref 0.3–1.2)
Total Protein: 7.4 g/dL (ref 6.5–8.1)

## 2023-04-25 MED ORDER — KETOROLAC TROMETHAMINE 30 MG/ML IJ SOLN
15.0000 mg | Freq: Once | INTRAMUSCULAR | Status: AC
  Administered 2023-04-25: 15 mg via INTRAVENOUS
  Filled 2023-04-25: qty 1

## 2023-04-25 MED ORDER — ONDANSETRON HCL 4 MG/2ML IJ SOLN
4.0000 mg | Freq: Once | INTRAMUSCULAR | Status: AC
  Administered 2023-04-25: 4 mg via INTRAVENOUS
  Filled 2023-04-25: qty 2

## 2023-04-25 MED ORDER — SODIUM CHLORIDE 0.9 % IV BOLUS
500.0000 mL | Freq: Once | INTRAVENOUS | Status: AC
  Administered 2023-04-25: 500 mL via INTRAVENOUS

## 2023-04-25 NOTE — ED Provider Notes (Signed)
[facility] Provider Note    Event Date/Time   First MD Initiated Contact with Patient 04/25/23 0122     (approximate)   History   Dizziness   HPI  Alexis Chandler is a 27 y.o. 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  Endorses associated nausea without vomiting.  Patient was constantly bending her head and felt pressure when she bent her head down.  Endorses sensation of lightheadedness.  History of pseudotumor cerebri, diagnosed in 2020, not on acetazolamide.  Denies recent fever/chills, cough, chest pain, shortness of breath, abdominal pain, vomiting.  Denies vision changes, neck pain, extremity weakness/numbness/tingling.     Past Medical History   Past Medical History:  Diagnosis Date   Migraine    Pseudotumor cerebri      Active Problem List   Patient Active Problem List   Diagnosis Date Noted   Encounter for well woman exam with routine gynecological exam 04/30/2022   History of cesarean delivery 04/30/2022   History of cervical incompetence 04/30/2022   History of preterm delivery 04/30/2022   Pseudotumor cerebri syndrome 04/30/2022   Oligomenorrhea 04/30/2022   Acute suppurative otitis media of both ears without spontaneous rupture of tympanic membranes 09/14/2020   BMI 40.0-44.9, adult (HCC) 05/01/2020   Nonintractable episodic headache 03/24/2020   Finger injury, right, initial encounter 12/19/2017   ST (sore throat) 11/07/2017   Dry skin dermatitis 10/23/2017     Past Surgical History   Past Surgical History:  Procedure Laterality Date   CESAREAN SECTION     IR FL GUIDED LOC OF NEEDLE/CATH TIP FOR SPINAL INJECTION LT  10/22/2021     Home Medications   Prior to Admission medications   Medication Sig Start Date End Date Taking? Authorizing Provider  acetaZOLAMIDE ER (DIAMOX) 500 MG capsule Take 1 capsule (500 mg total) by mouth 2 (two) times daily.  10/21/21   Minna Antis, MD  cetirizine (ZYRTEC ALLERGY) 10 MG tablet Take 1 tablet (10 mg total) by mouth daily. 09/30/21 09/30/22  Ward, Layla Maw, DO  fluticasone (FLONASE) 50 MCG/ACT nasal spray Place 1 spray into both nostrils daily. 09/30/21 09/30/22  Ward, Layla Maw, DO  ibuprofen (ADVIL) 800 MG tablet Take 1 tablet (800 mg total) by mouth 3 (three) times daily. 03/11/23   White, Elita Boone, NP  ipratropium (ATROVENT) 0.03 % nasal spray Place 2 sprays into both nostrils every 12 (twelve) hours. 03/11/23   White, Elita Boone, NP  lidocaine (XYLOCAINE) 2 % solution Use as directed 15 mLs in the mouth or throat every 4 (four) hours as needed for mouth pain. 03/11/23   Valinda Hoar, NP     Allergies  Shellfish allergy   Family History  History reviewed. No pertinent family history.   Physical Exam  Triage Vital Signs: ED Triage Vitals  Encounter Vitals Group     BP 04/24/23 2330 (!) 135/98     Systolic BP Percentile --      Diastolic BP Percentile --      Pulse Rate 04/24/23 2330 99     Resp 04/24/23 2330 18     Temp 04/24/23 2330 98.8 F (37.1 C)     Temp src --      SpO2 04/24/23 2330 100 %     Weight 04/24/23 2333 229 lb 15 oz (104.3 kg)     Height 04/24/23 2333 5\' 5"  (1.651 m)  Head Circumference --      Peak Flow --      Pain Score 04/24/23 2331 0     Pain Loc --      Pain Education --      Exclude from Growth Chart --     Updated Vital Signs: BP 129/70   Pulse 81   Temp 98.8 F (37.1 C)   Resp 18   Ht 5\' 5"  (1.651 m)   Wt 104.3 kg   LMP 03/07/2023   SpO2 99%   BMI 38.26 kg/m    General: Awake, no distress.  Eating pizza. CV:  RRR.  Good peripheral perfusion.  Resp:  Normal effort.  CTAB. Abd:  No distention.  Other:  PERRL.  EOMI.  No carotid bruits.  Supple neck without meningismus.  Alert and oriented x 3.  CN II-XII grossly intact.  5/5 motor strength and sensation all extremities.  MAE x 4.  No petechiae.   ED Results / Procedures /  Treatments  Labs (all labs ordered are listed, but only abnormal results are displayed) Labs Reviewed  CBC WITH DIFFERENTIAL/PLATELET - Abnormal; Notable for the following components:      Result Value   Hemoglobin 11.9 (*)    All other components within normal limits  URINALYSIS, ROUTINE W REFLEX MICROSCOPIC - Abnormal; Notable for the following components:   Color, Urine YELLOW (*)    APPearance HAZY (*)    Hgb urine dipstick SMALL (*)    All other components within normal limits  COMPREHENSIVE METABOLIC PANEL  POC URINE PREG, ED  TROPONIN I (HIGH SENSITIVITY)  TROPONIN I (HIGH SENSITIVITY)     EKG  ED ECG REPORT I, Jaye Saal J, the attending physician, personally viewed and interpreted this ECG.   Date: 04/25/2023  EKG Time: 2334  Rate: 94  Rhythm: normal sinus rhythm  Axis: Normal  Intervals:none  ST&T Change: Nonspecific    RADIOLOGY I have independently visualized and interpreted patient's imaging study as well as noted the radiology interpretation:  Chest x-ray: No acute cardiopulmonary process  Official radiology report(s): DG Chest 2 View  Result Date: 04/25/2023 CLINICAL DATA:  Dizziness. EXAM: CHEST - 2 VIEW COMPARISON:  None Available. FINDINGS: The heart size and mediastinal contours are within normal limits. Low lung volumes are noted. Both lungs are clear. The visualized skeletal structures are unremarkable. IMPRESSION: No active cardiopulmonary disease. Electronically Signed   By: Aram Candela M.D.   On: 04/25/2023 01:37     PROCEDURES:  Critical Care performed: No  .1-3 Lead EKG Interpretation  Performed by: Irean Hong, MD Authorized by: Irean Hong, MD     Interpretation: normal     ECG rate:  95   ECG rate assessment: normal     Rhythm: sinus rhythm     Ectopy: none     Conduction: normal   Comments:     Patient placed on cardiac monitor to evaluate for arrhythmias    MEDICATIONS ORDERED IN ED: Medications  ondansetron  (ZOFRAN) injection 4 mg (4 mg Intravenous Given 04/25/23 0200)  ketorolac (TORADOL) 30 MG/ML injection 15 mg (15 mg Intravenous Given 04/25/23 0159)  sodium chloride 0.9 % bolus 500 mL (500 mLs Intravenous New Bag/Given 04/25/23 0202)     IMPRESSION / MDM / ASSESSMENT AND PLAN / ED COURSE  I reviewed the triage vital signs and the nursing notes.  27 year old female presenting with dizziness.  Differential diagnosis includes but is not limited to CVA, TIA, ACS, metabolic, infectious etiologies, etc.  I personally reviewed patient's records and note a GYN office visit on 03/18/2023 for elevated BMI, preconception consultation.  Patient's presentation is most consistent with acute presentation with potential threat to life or bodily function.  The patient is on the cardiac monitor to evaluate for evidence of arrhythmia and/or significant heart rate changes.  Laboratory results demonstrate normal WBC 10.2, unremarkable electrolytes, negative troponin, unremarkable UA and chest x-ray.  Will obtain orthostatic vital signs, administer Ketorolac and Zofran.  Will reassess.  Clinical Course as of 04/25/23 0326  Fri Apr 25, 2023  1610 Repeat troponin remains negative.  Orthostatics unremarkable.  Strict return precautions given.  Patient verbalizes understanding and agrees with plan of care. [JS]    Clinical Course User Index [JS] Irean Hong, MD     FINAL CLINICAL IMPRESSION(S) / ED DIAGNOSES   Final diagnoses:  Dizziness     Rx / DC Orders   ED Discharge Orders     None        Note:  This document was prepared using Dragon voice recognition software and may include unintentional dictation errors.   Irean Hong, MD 04/25/23 [phone]

## 2023-04-25 NOTE — Discharge Instructions (Signed)
Stay indoors this weekend and drink plenty of fluids.  Return to the ER for worsening symptoms, persistent vomiting, difficulty breathing or other concerns.

## 2023-06-10 ENCOUNTER — Other Ambulatory Visit: Payer: Self-pay

## 2023-06-10 MED ORDER — FLULAVAL 0.5 ML IM SUSY
0.5000 mL | PREFILLED_SYRINGE | Freq: Once | INTRAMUSCULAR | 0 refills | Status: AC
  Filled 2023-06-10: qty 0.5, 1d supply, fill #0

## 2023-10-14 ENCOUNTER — Other Ambulatory Visit: Payer: Self-pay

## 2023-10-14 MED ORDER — AMOXICILLIN 500 MG PO CAPS
500.0000 mg | ORAL_CAPSULE | Freq: Three times a day (TID) | ORAL | 1 refills | Status: DC
  Filled 2023-10-14: qty 21, 7d supply, fill #0

## 2023-10-14 MED ORDER — CHLORHEXIDINE GLUCONATE 0.12 % MT SOLN
15.0000 mL | Freq: Two times a day (BID) | OROMUCOSAL | 0 refills | Status: DC
  Filled 2023-10-14: qty 473, 16d supply, fill #0

## 2023-10-24 ENCOUNTER — Other Ambulatory Visit: Payer: Self-pay

## 2023-10-24 MED ORDER — METFORMIN HCL ER 500 MG PO TB24
ORAL_TABLET | ORAL | 11 refills | Status: AC
  Filled 2023-10-24: qty 60, 30d supply, fill #0

## 2023-10-24 MED ORDER — MEDROXYPROGESTERONE ACETATE 10 MG PO TABS: 10.0000 mg | ORAL_TABLET | Freq: Every day | ORAL | 1 refills | Status: DC

## 2023-10-24 MED ORDER — TOPIRAMATE 25 MG PO TABS
ORAL_TABLET | ORAL | 5 refills | Status: AC
  Filled 2023-10-24: qty 120, 30d supply, fill #0
  Filled 2023-12-16: qty 120, 30d supply, fill #1
  Filled 2024-02-16: qty 120, 30d supply, fill #2
  Filled 2024-04-14: qty 120, 30d supply, fill #3
  Filled 2024-06-07: qty 120, 30d supply, fill #4

## 2023-10-24 MED ORDER — ACETAMINOPHEN EXTRA STRENGTH 500 MG PO TABS: 1000.0000 mg | ORAL_TABLET | Freq: Four times a day (QID) | ORAL | 0 refills | Status: AC | PRN

## 2023-10-24 MED ORDER — DOXYCYCLINE HYCLATE 100 MG PO TABS
100.0000 mg | ORAL_TABLET | Freq: Two times a day (BID) | ORAL | 3 refills | Status: DC
  Filled 2023-10-24: qty 60, 30d supply, fill #0

## 2023-11-06 ENCOUNTER — Other Ambulatory Visit: Payer: Self-pay

## 2023-12-08 ENCOUNTER — Other Ambulatory Visit: Payer: Self-pay

## 2023-12-08 MED ORDER — AMOXICILLIN 500 MG PO CAPS
500.0000 mg | ORAL_CAPSULE | Freq: Three times a day (TID) | ORAL | 0 refills | Status: DC
  Filled 2023-12-08: qty 21, 7d supply, fill #0

## 2023-12-16 ENCOUNTER — Other Ambulatory Visit: Payer: Self-pay

## 2023-12-16 MED ORDER — FLUCONAZOLE 150 MG PO TABS
150.0000 mg | ORAL_TABLET | Freq: Once | ORAL | 0 refills | Status: AC
  Filled 2023-12-16: qty 1, 1d supply, fill #0

## 2023-12-17 ENCOUNTER — Other Ambulatory Visit: Payer: Self-pay

## 2023-12-24 ENCOUNTER — Encounter: Payer: Self-pay | Admitting: Intensive Care

## 2023-12-24 ENCOUNTER — Other Ambulatory Visit: Payer: Self-pay

## 2023-12-24 ENCOUNTER — Emergency Department
Admission: EM | Admit: 2023-12-24 | Discharge: 2023-12-24 | Disposition: A | Discharge status: Home / Self Care | Payer: Self-pay | Attending: Emergency Medicine | Admitting: Emergency Medicine

## 2023-12-24 DIAGNOSIS — M25562 Pain in left knee: Secondary | ICD-10-CM | POA: Insufficient documentation

## 2023-12-24 DIAGNOSIS — G8929 Other chronic pain: Secondary | ICD-10-CM | POA: Insufficient documentation

## 2023-12-24 HISTORY — DX: Polycystic ovarian syndrome: E28.2

## 2023-12-24 MED ORDER — MELOXICAM 15 MG PO TABS: 15.0000 mg | ORAL_TABLET | Freq: Every day | ORAL | 2 refills | Status: AC

## 2023-12-24 MED ORDER — KETOROLAC TROMETHAMINE 30 MG/ML IJ SOLN
30.0000 mg | Freq: Once | INTRAMUSCULAR | Status: AC
  Administered 2023-12-24: 30 mg via INTRAMUSCULAR
  Filled 2023-12-24: qty 1

## 2023-12-24 NOTE — ED Triage Notes (Signed)
 Patient c/o left knee pain. Reports history of fracture and the past two months pain has been occurring again. Denies recent injury

## 2023-12-24 NOTE — ED Provider Notes (Signed)
 [facility] Provider Note    Event Date/Time   First MD Initiated Contact with Patient 12/24/23 [phone]     (approximate)   History   Knee Pain   HPI  Alexis Chandler is a 28 y.o. female who presents with complaints of left knee pain.  Patient reports she broke her left knee greater than 10 years ago.  She reports over the last few months it has been aching especially at the end of the day.  Over the last week it has been more painful and she is having to limp somewhat.  No falls or trauma.  No swelling.     Physical Exam   Triage Vital Signs: ED Triage Vitals  Encounter Vitals Group     BP 12/24/23 0829 (!) 137/94     Systolic BP Percentile --      Diastolic BP Percentile --      Pulse Rate 12/24/23 0827 72     Resp 12/24/23 0827 16     Temp 12/24/23 0827 98.6 F (37 C)     Temp Source 12/24/23 0827 Oral     SpO2 12/24/23 0827 100 %     Weight 12/24/23 0829 122.5 kg (270 lb)     Height 12/24/23 0829 1.626 m (5\' 4" )     Head Circumference --      Peak Flow --      Pain Score 12/24/23 0828 8     Pain Loc --      Pain Education --      Exclude from Growth Chart --     Most recent vital signs: Vitals:   12/24/23 0827 12/24/23 0829  BP:  (!) 137/94  Pulse: 72   Resp: 16   Temp: 98.6 F (37 C)   SpO2: 100%      General: Awake, no distress.  CV:  Good peripheral perfusion.  Resp:  Normal effort.  Abd:  No distention.  Other:  Left knee: No tenderness palpation, good flexion and extension, mild pain with valgus stress   ED Results / Procedures / Treatments   Labs (all labs ordered are listed, but only abnormal results are displayed) Labs Reviewed - No data to display   EKG     RADIOLOGY     PROCEDURES:  Critical Care performed:   Procedures   MEDICATIONS ORDERED IN ED: Medications  ketorolac  (TORADOL ) 30 MG/ML injection 30 mg (30 mg Intramuscular Given 12/24/23 0900)     IMPRESSION / MDM / ASSESSMENT AND PLAN /  ED COURSE  I reviewed the triage vital signs and the nursing notes. Patient's presentation is most consistent with exacerbation of chronic illness.  Patient with worsening chronic knee pain overall reassuring exam, possibly some LCL soreness.  However she is ambulating well, no indication for x-rays at this time, will refer to Ortho for workup including likely MRI.  Will start the patient on meloxicam         FINAL CLINICAL IMPRESSION(S) / ED DIAGNOSES   Final diagnoses:  Chronic pain of left knee     Rx / DC Orders   ED Discharge Orders          Ordered    meloxicam  (MOBIC ) 15 MG tablet  Daily        12/24/23 0856             Note:  This document was prepared using Dragon voice recognition software and may include unintentional dictation errors.   Bryson Carbine,  MD 12/24/23 1009

## 2023-12-24 NOTE — ED Notes (Signed)
 See triage notes. Patient c/o severe left knee pain. Patient stated she fractured that knee and the past two months, pain has been occurring. NAD

## 2024-01-23 IMAGING — XA IR FLUORO GUIDE SPINAL/SI JT INJ*L*
1 series · 3 of 3 positions shown · non-contrast
Comparison: CT head performed 10/21/2021 report and images reviewed
prior to the procedure.

INDICATION: Pseudotumor cerebri, request received for diagnostic and therapeutic
lumbar puncture patient with complaints of visual loss.

EXAM:
DIAGNOSTIC AND THERAPEUTIC LUMBAR PUNCTURE UNDER FLUOROSCOPIC
GUIDANCE

[Series 1: vasc extremity · 3 of 3 slices shown]
[im 1/3]
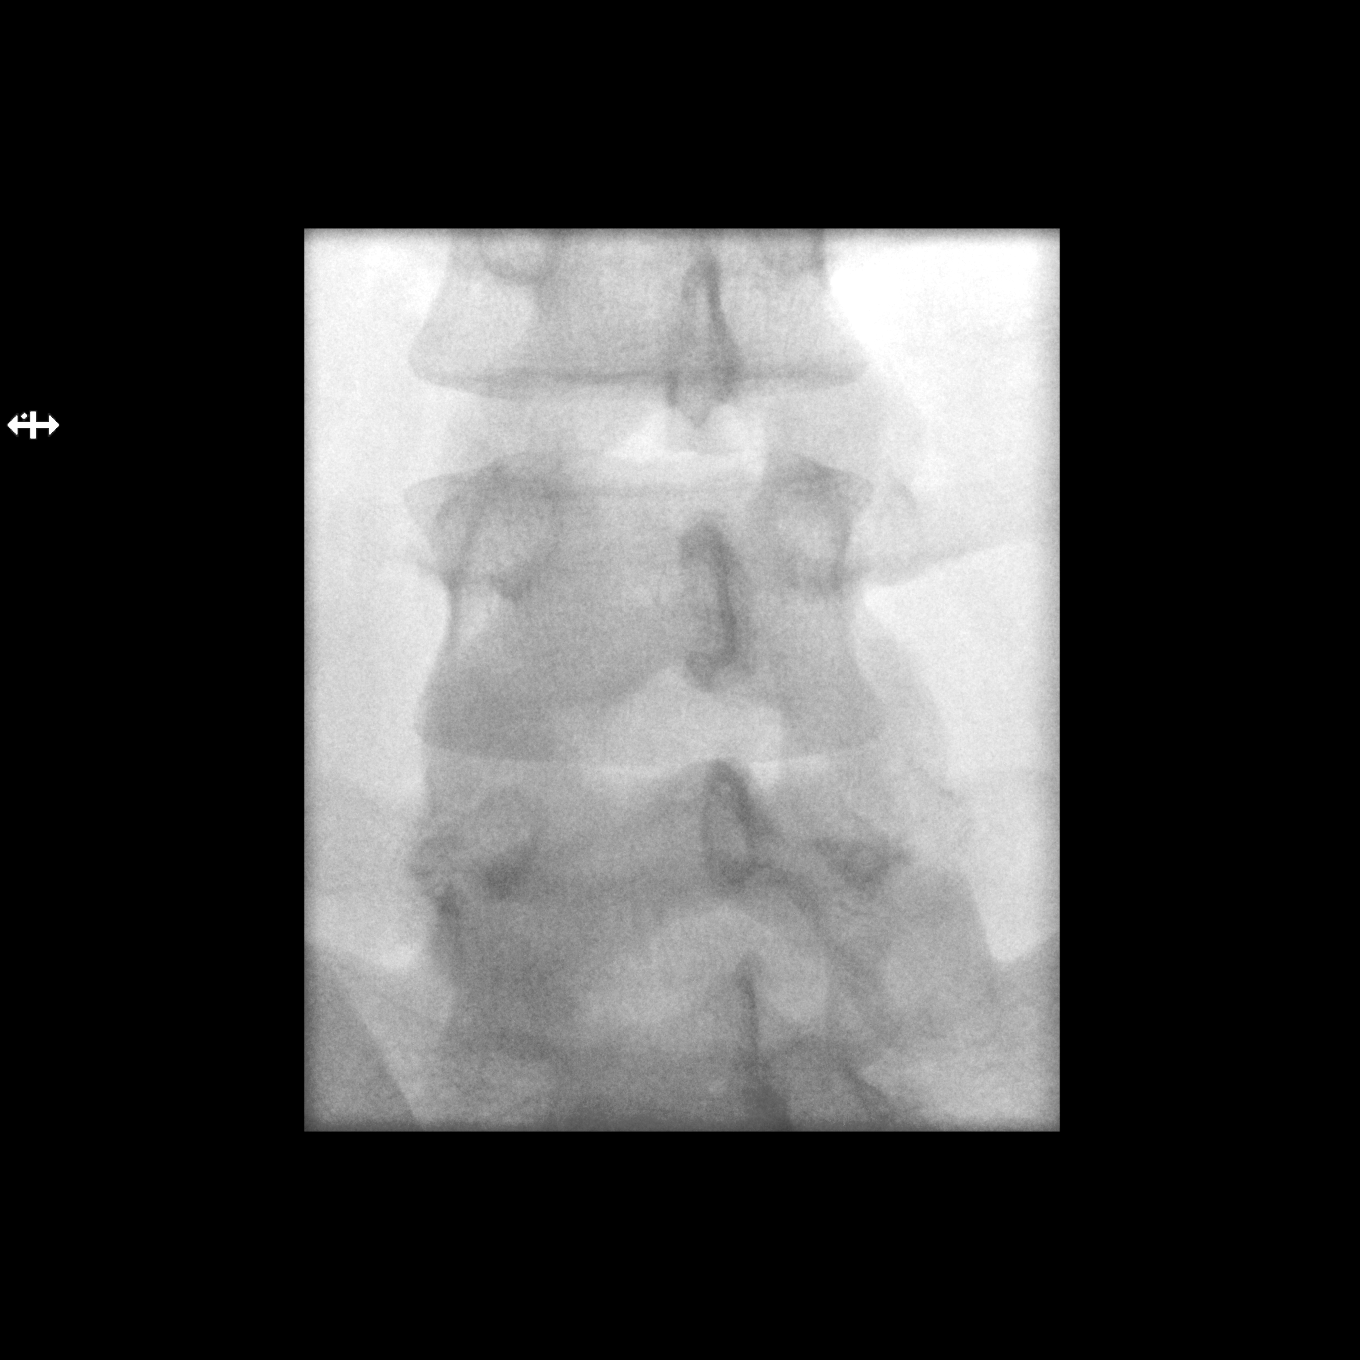
[im 2/3]
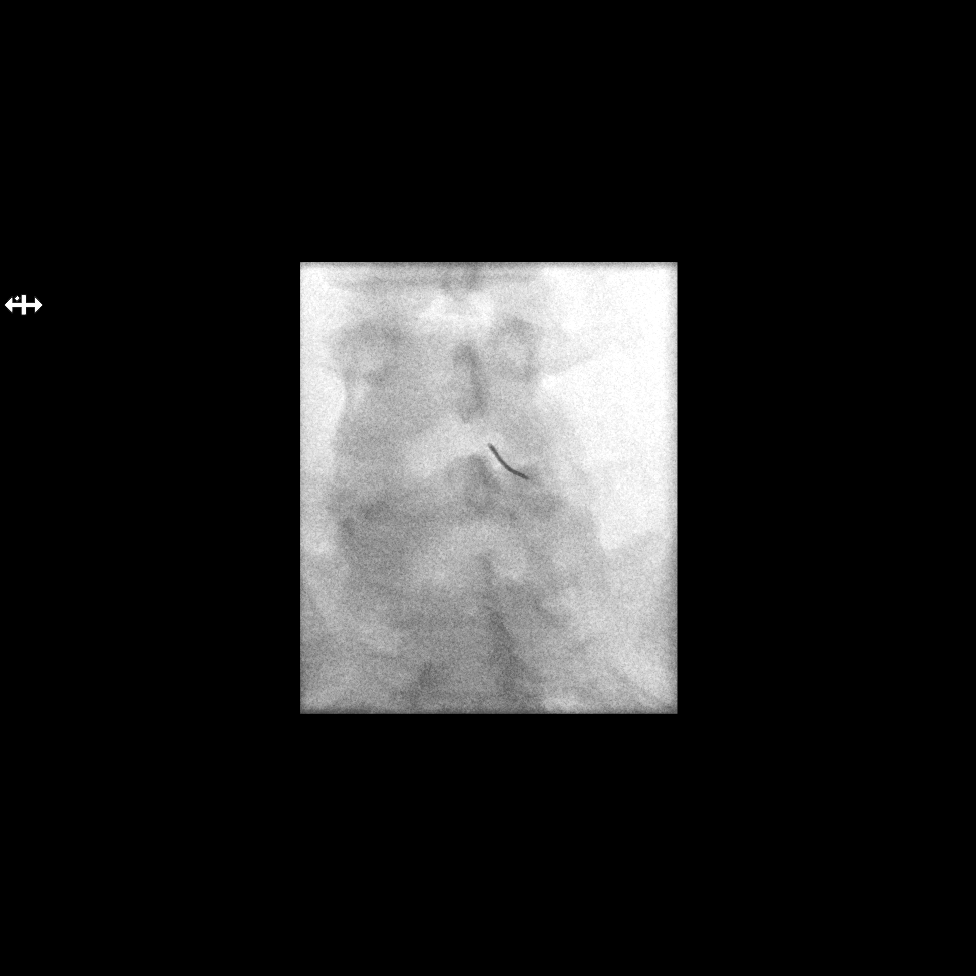
[im 3/3]
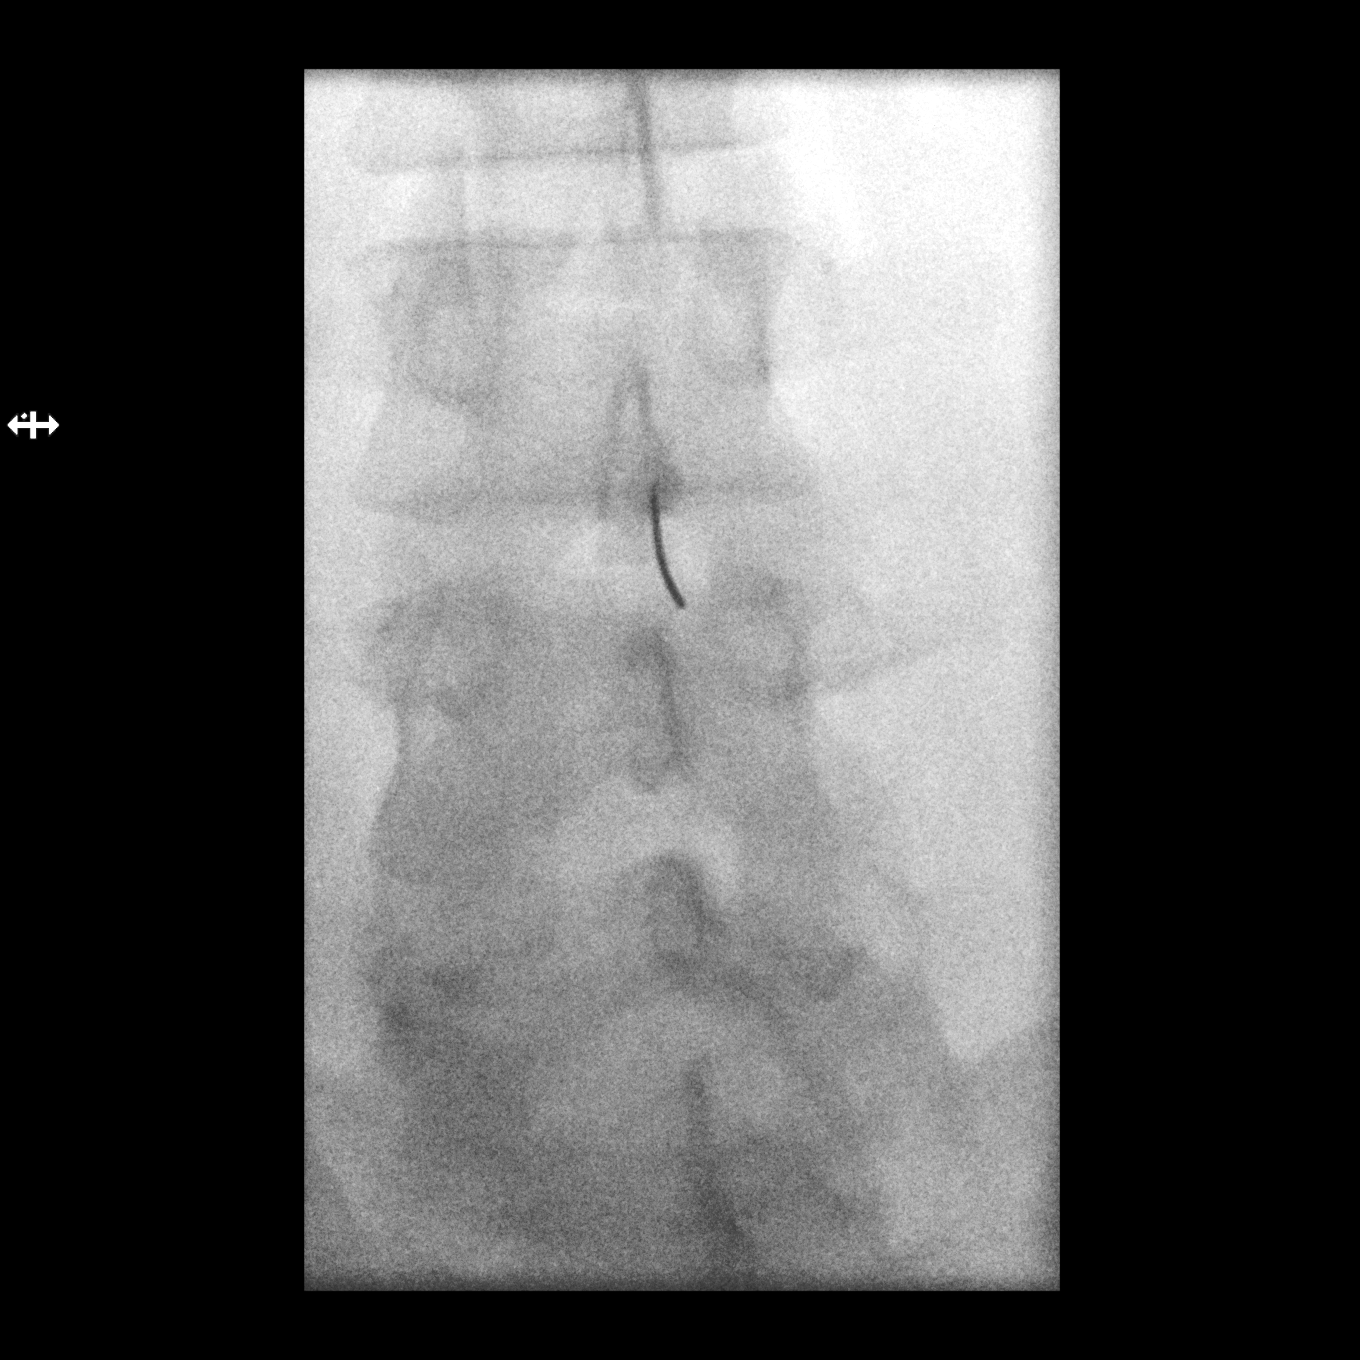

[3 of 3 positions shown; findings below may reference images not displayed]

MEDICATIONS:
Local lidocaine 1% only.

ANESTHESIA/SEDATION:
Local 1% lidocaine only.

FLUOROSCOPY:
Radiation Exposure Index (if provided by the fluoroscopic device):
22.10 mGy

COMPLICATIONS:
None immediate.

PROCEDURE:
Informed consent was obtained from the patient/patient's Autry
Mui prior to the procedure, including potential complications
of bleeding, infection, paresthesias, nerve damage, CSF leak
requiring additional procedures, post procedure requirement to lay
flat for several hours after the procedure, headache, allergy, and
pain. With the patient prone, the lower back was prepped with
Betadine. 1% Lidocaine was used for local anesthesia. Lumbar
puncture was performed at the L4-L5 level using a 20 gauge needle
without return of CSF therefore needle was removed in its entirety.
Lumbar puncture was performed at the L3-L4 level using a 20 gauge
needle with return of colorless CSF with an opening pressure of 36
cm water. 20 ml of CSF were obtained for laboratory studies. A
closing pressure of 22 cm of water. The inner stylet was placed back
into the needle and the needle was removed in its entirety. The
patient tolerated the procedure well and there were no apparent
complications. A sterile bandage was applied.
IMPRESSION: Technically successful fluoroscopic guided diagnostic and
therapeutic lumbar puncture.

This exam was performed by Maesaa Houri, and was supervised
and interpreted by Dr. Bracko.

## 2024-02-16 ENCOUNTER — Other Ambulatory Visit: Payer: Self-pay

## 2024-04-14 ENCOUNTER — Other Ambulatory Visit: Payer: Self-pay

## 2024-04-14 MED ORDER — TOPIRAMATE ER 50 MG PO SPRINKLE CAP24
EXTENDED_RELEASE_CAPSULE | ORAL | 6 refills | Status: DC
  Filled 2024-04-14 (×2): qty 90, 30d supply, fill #0

## 2024-04-17 ENCOUNTER — Emergency Department: Payer: Self-pay

## 2024-04-17 ENCOUNTER — Other Ambulatory Visit: Payer: Self-pay

## 2024-04-17 ENCOUNTER — Emergency Department
Admission: EM | Admit: 2024-04-17 | Discharge: 2024-04-17 | Disposition: A | Discharge status: Home / Self Care | Payer: Self-pay | Attending: Emergency Medicine | Admitting: Emergency Medicine

## 2024-04-17 DIAGNOSIS — R079 Chest pain, unspecified: Secondary | ICD-10-CM | POA: Insufficient documentation

## 2024-04-17 DIAGNOSIS — G43909 Migraine, unspecified, not intractable, without status migrainosus: Secondary | ICD-10-CM | POA: Insufficient documentation

## 2024-04-17 LAB — BASIC METABOLIC PANEL WITH GFR
Anion gap: 10 (ref 5–15)
BUN: 16 mg/dL (ref 6–20)
CO2: 20 mmol/L — ABNORMAL LOW (ref 22–32)
Calcium: 9.2 mg/dL (ref 8.9–10.3)
Chloride: 108 mmol/L (ref 98–111)
Creatinine, Ser: 1.19 mg/dL — ABNORMAL HIGH (ref 0.44–1.00)
GFR, Estimated: >60 mL/min
Glucose, Bld: 118 mg/dL — ABNORMAL HIGH (ref 70–99)
Potassium: 3.7 mmol/L (ref 3.5–5.1)
Sodium: 138 mmol/L (ref 135–145)

## 2024-04-17 LAB — CBC
HCT: 40 % (ref 36.0–46.0)
Hemoglobin: 12.9 g/dL (ref 12.0–15.0)
MCH: 26.8 pg (ref 26.0–34.0)
MCHC: 32.3 g/dL (ref 30.0–36.0)
MCV: 83.2 fL (ref 80.0–100.0)
Platelets: 365 K/uL (ref 150–400)
RBC: 4.81 MIL/uL (ref 3.87–5.11)
RDW: 14.2 % (ref 11.5–15.5)
WBC: 8.1 K/uL (ref 4.0–10.5)
nRBC: 0 % (ref 0.0–0.2)

## 2024-04-17 LAB — TROPONIN I (HIGH SENSITIVITY): Troponin I (High Sensitivity): <2 ng/L (ref <18)

## 2024-04-17 MED ORDER — DIPHENHYDRAMINE HCL 25 MG PO CAPS
50.0000 mg | ORAL_CAPSULE | Freq: Once | ORAL | Status: AC
  Administered 2024-04-17: 50 mg via ORAL
  Filled 2024-04-17: qty 2

## 2024-04-17 MED ORDER — METOCLOPRAMIDE HCL 10 MG PO TABS
10.0000 mg | ORAL_TABLET | Freq: Once | ORAL | Status: AC
  Administered 2024-04-17: 10 mg via ORAL
  Filled 2024-04-17: qty 1

## 2024-04-17 MED ORDER — KETOROLAC TROMETHAMINE 30 MG/ML IJ SOLN
30.0000 mg | Freq: Once | INTRAMUSCULAR | Status: AC
  Administered 2024-04-17: 30 mg via INTRAMUSCULAR
  Filled 2024-04-17: qty 1

## 2024-04-17 MED ORDER — METOCLOPRAMIDE HCL 10 MG PO TABS
10.0000 mg | ORAL_TABLET | Freq: Three times a day (TID) | ORAL | 0 refills | Status: AC
  Filled 2024-04-17: qty 30, 10d supply, fill #0

## 2024-04-17 NOTE — ED Provider Notes (Signed)
 [facility] Emergency Department Provider Note     Event Date/Time   First MD Initiated Contact with Patient 04/17/24 2015     (approximate)   History   Chest Pain   HPI  Alexis Chandler is a 28 y.o. female with a history of migraine, pseudotumor cerebri, PCOS, and anxiety, presents to the ED endorsing 1 hour of chest pain.  Patient was at work when symptoms began about an hour prior to presentation.  She reports some associated facial numbness.  No facial droop, paralysis, weakness, syncope reported.  No cough, congestion, or shortness of breath reported.   Physical Exam   Triage Vital Signs: ED Triage Vitals  Encounter Vitals Group     BP 04/17/24 1815 132/77     Girls Systolic BP Percentile --      Girls Diastolic BP Percentile --      Boys Systolic BP Percentile --      Boys Diastolic BP Percentile --      Pulse Rate 04/17/24 1815 98     Resp 04/17/24 1815 16     Temp 04/17/24 1815 98 F (36.7 C)     Temp Source 04/17/24 1815 Oral     SpO2 04/17/24 1815 100 %     Weight --      Height 04/17/24 1814 5' 4 (1.626 m)     Head Circumference --      Peak Flow --      Pain Score 04/17/24 1813 7     Pain Loc --      Pain Education --      Exclude from Growth Chart --     Most recent vital signs: Vitals:   04/17/24 2113 04/17/24 2211  BP: 130/88 125/83  Pulse: 77 79  Resp: 18 18  Temp:  98.3 F (36.8 C)  SpO2: 98% 99%    General Awake, no distress. NAD HEENT NCAT. PERRL. EOMI. No rhinorrhea. Mucous membranes are moist.  CV:  Good peripheral perfusion. RRR RESP:  Normal effort. CTA ABD:  No distention.  NEURO: Cranial nerves II to XII grossly intact   ED Results / Procedures / Treatments   Labs (all labs ordered are listed, but only abnormal results are displayed) Labs Reviewed  BASIC METABOLIC PANEL WITH GFR - Abnormal; Notable for the following components:      Result Value   CO2 20 (*)    Glucose, Bld 118 (*)     Creatinine, Ser 1.19 (*)    All other components within normal limits  CBC  POC URINE PREG, ED  TROPONIN I (HIGH SENSITIVITY)    EKG  Vent. rate 88 BPM PR interval 176 ms QRS duration 72 ms QT/QTcB 358/433 ms P-R-T axes 50 -1 3 Normal sinus rhythm Minimal voltage criteria for LVH, may be normal variant ( R in aVL ) Cannot rule out Anterior infarct (cited on or before 24-Apr-2023) No STEMI  RADIOLOGY  I personally viewed and evaluated these images as part of my medical decision making, as well as reviewing the written report by the radiologist.  ED Provider Interpretation: No acute findings  DG Chest 2 View Result Date: 04/17/2024 CLINICAL DATA:  Chest pain. EXAM: DG CHEST 2V COMPARISON:  April 24, 2023 FINDINGS: The heart size and mediastinal contours are within normal limits. Low lung volumes are noted with mild, stable elevation of the right hemidiaphragm. Both lungs are clear. The visualized skeletal structures are unremarkable. IMPRESSION: No active cardiopulmonary disease.  Electronically Signed   By: Suzen Dials M.D.   On: 04/17/2024 18:39     PROCEDURES:  Critical Care performed: No  Procedures   MEDICATIONS ORDERED IN ED: Medications  ketorolac  (TORADOL ) 30 MG/ML injection 30 mg (30 mg Intramuscular Given 04/17/24 2109)  metoCLOPramide  (REGLAN ) tablet 10 mg (10 mg Oral Given 04/17/24 2108)  diphenhydrAMINE  (BENADRYL ) capsule 50 mg (50 mg Oral Given 04/17/24 2108)     IMPRESSION / MDM / ASSESSMENT AND PLAN / ED COURSE  I reviewed the triage vital signs and the nursing notes.                              Differential diagnosis includes, but is not limited to, ACS, aortic dissection, pulmonary embolism, cardiac tamponade, pneumothorax, pneumonia, pericarditis, myocarditis, GI-related causes including esophagitis/gastritis, and musculoskeletal chest wall pain.     Patient's presentation is most consistent with acute complicated illness / injury requiring  diagnostic workup.  Patient's diagnosis is consistent with nonspecific chest pain and migraine.  With reassuring dental workup at this time.  Her cardiac workup is overall unremarkable.  She is endorsing ongoing headache pain for the last 3 days.  She is reassured by her workup from a cardiac standpoint.  No evidence of acute lab abnormalities.  Troponin is normal, and EKG shows no acute acute malignant arrhythmia.  No chest x-ray indication of acute intrathoracic process, based on my interpretation of images.  Low concern for ACS given patient's reassuring history and low risk factors.  No concern for PE as she has had no reports of shortness of breath.  She did agree to acute management of her migraine.  At the time of this interval evaluation, patient is endorsing send the improvement of her migraine as well as her central chest pain.  She stable at this time for outpatient management.  Patient will be discharged home with prescriptions for Reglan . Patient is to follow up with her primary provider as needed or otherwise directed. Patient is given ED precautions to return to the ED for any worsening or new symptoms.   FINAL CLINICAL IMPRESSION(S) / ED DIAGNOSES   Final diagnoses:  Nonspecific chest pain  Migraine without status migrainosus, not intractable, unspecified migraine type     Rx / DC Orders   ED Discharge Orders          Ordered    metoCLOPramide  (REGLAN ) 10 MG tablet  3 times daily with meals        04/17/24 2149             Note:  This document was prepared using Dragon voice recognition software and may include unintentional dictation errors.    Loyd Candida LULLA Aldona, PA-C 04/17/24 2219    Floy Roberts, MD 04/17/24 [phone]

## 2024-04-17 NOTE — ED Triage Notes (Addendum)
 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 Pt is caox4, in no acute distress in triage. Pt has HX of anxiety and panic attacks.

## 2024-04-17 NOTE — ED Notes (Signed)
 Blue top sent down.

## 2024-04-17 NOTE — Discharge Instructions (Signed)
 Your exam, labs, EKG, and chest x-ray are normal and reassuring at this time.  No signs of a serious underlying cardiac or pulmonary cause of your chest pain.   Consider taking OTC Benadryl  along with the prescription nausea medicine for headache pain relief.  You should follow-up with your primary provider for ongoing patient.

## 2024-04-18 ENCOUNTER — Other Ambulatory Visit: Payer: Self-pay

## 2024-04-20 ENCOUNTER — Other Ambulatory Visit: Payer: Self-pay

## 2024-04-20 MED ORDER — ONDANSETRON 4 MG PO TBDP
4.0000 mg | ORAL_TABLET | Freq: Three times a day (TID) | ORAL | 0 refills | Status: AC | PRN
  Filled 2024-04-20: qty 10, 4d supply, fill #0

## 2024-04-21 ENCOUNTER — Other Ambulatory Visit: Payer: Self-pay

## 2024-04-21 MED ORDER — RIZATRIPTAN BENZOATE 10 MG PO TABS
ORAL_TABLET | ORAL | 2 refills | Status: AC
  Filled 2024-04-21: qty 30, 15d supply, fill #0

## 2024-04-21 MED ORDER — NAPROXEN 500 MG PO TABS
ORAL_TABLET | ORAL | 0 refills | Status: AC
  Filled 2024-04-21: qty 30, 15d supply, fill #0

## 2024-04-23 ENCOUNTER — Other Ambulatory Visit: Payer: Self-pay

## 2024-05-04 ENCOUNTER — Other Ambulatory Visit: Payer: Self-pay

## 2024-05-04 MED ORDER — SPIRONOLACTONE 25 MG PO TABS
25.0000 mg | ORAL_TABLET | Freq: Every day | ORAL | 3 refills | Status: AC
  Filled 2024-05-04: qty 100, 100d supply, fill #0

## 2024-05-04 MED ORDER — AMOXICILLIN-POT CLAVULANATE 500-125 MG PO TABS
1.0000 | ORAL_TABLET | Freq: Two times a day (BID) | ORAL | 2 refills | Status: AC
  Filled 2024-05-04: qty 20, 10d supply, fill #0
  Filled 2024-05-05: qty 40, 20d supply, fill #0
  Filled 2024-08-04: qty 20, 10d supply, fill #1
  Filled 2024-08-05: qty 40, 20d supply, fill #1

## 2024-05-05 ENCOUNTER — Other Ambulatory Visit: Payer: Self-pay

## 2024-05-07 ENCOUNTER — Other Ambulatory Visit: Payer: Self-pay

## 2024-06-07 ENCOUNTER — Other Ambulatory Visit: Payer: Self-pay

## 2024-07-05 ENCOUNTER — Other Ambulatory Visit: Payer: Self-pay

## 2024-07-05 MED ORDER — PAROXETINE HCL 20 MG PO TABS
20.0000 mg | ORAL_TABLET | Freq: Every day | ORAL | 3 refills | Status: AC
  Filled 2024-07-31: qty 90, 90d supply, fill #0

## 2024-07-05 MED ORDER — HYDROXYZINE HCL 25 MG PO TABS: 25.0000 mg | ORAL_TABLET | Freq: Three times a day (TID) | ORAL | 2 refills | Status: AC | PRN

## 2024-07-05 MED ORDER — MELOXICAM 15 MG PO TABS: 15.0000 mg | ORAL_TABLET | Freq: Every day | ORAL | 2 refills | Status: AC

## 2024-07-05 MED ORDER — NORETHINDRONE 0.35 MG PO TABS
1.0000 | ORAL_TABLET | Freq: Every day | ORAL | 4 refills | Status: AC
  Filled 2024-07-05: qty 28, 28d supply, fill #0
  Filled 2024-07-31: qty 28, 28d supply, fill #1
  Filled 2024-07-31: qty 84, 84d supply, fill #1

## 2024-07-05 MED ORDER — TOPIRAMATE 100 MG PO TABS: 100.0000 mg | ORAL_TABLET | Freq: Two times a day (BID) | ORAL | 4 refills | Status: AC

## 2024-07-31 ENCOUNTER — Other Ambulatory Visit: Payer: Self-pay

## 2024-08-02 ENCOUNTER — Other Ambulatory Visit: Payer: Self-pay

## 2024-08-02 MED ORDER — RIZATRIPTAN BENZOATE 10 MG PO TABS
10.0000 mg | ORAL_TABLET | ORAL | 2 refills | Status: AC | PRN
  Filled 2024-08-02: qty 27, 30d supply, fill #0

## 2024-08-03 ENCOUNTER — Other Ambulatory Visit: Payer: Self-pay

## 2024-08-03 NOTE — Progress Notes (Signed)
 [facility] and had to request a call back from the pharmacist

## 2024-08-04 ENCOUNTER — Other Ambulatory Visit: Payer: Self-pay

## 2024-08-04 ENCOUNTER — Other Ambulatory Visit (HOSPITAL_COMMUNITY): Payer: Self-pay

## 2024-08-05 ENCOUNTER — Other Ambulatory Visit: Payer: Self-pay

## 2024-08-05 MED ORDER — LETROZOLE 2.5 MG PO TABS
2.5000 mg | ORAL_TABLET | ORAL | 0 refills | Status: AC
  Filled 2024-08-05: qty 5, 5d supply, fill #0

## 2024-08-05 MED ORDER — MEDROXYPROGESTERONE ACETATE 10 MG PO TABS
10.0000 mg | ORAL_TABLET | Freq: Every day | ORAL | 3 refills | Status: AC
  Filled 2024-08-05: qty 10, 10d supply, fill #0

## 2024-08-06 ENCOUNTER — Other Ambulatory Visit: Payer: Self-pay

## 2024-08-06 ENCOUNTER — Other Ambulatory Visit (HOSPITAL_COMMUNITY): Payer: Self-pay

## 2024-08-06 MED ORDER — HUMIRA-CD/UC/HS STARTER 80 MG/0.8ML ~~LOC~~ AJKT: AUTO-INJECTOR | SUBCUTANEOUS | 0 refills | Status: DC

## 2024-08-09 ENCOUNTER — Encounter: Payer: Self-pay | Admitting: Pharmacist

## 2024-08-09 ENCOUNTER — Other Ambulatory Visit: Payer: Self-pay | Admitting: Pharmacist

## 2024-08-09 ENCOUNTER — Other Ambulatory Visit: Payer: Self-pay

## 2024-08-09 ENCOUNTER — Telehealth: Payer: Self-pay

## 2024-08-09 ENCOUNTER — Ambulatory Visit: Payer: Self-pay | Attending: Family Medicine | Admitting: Pharmacist

## 2024-08-09 DIAGNOSIS — Z79899 Other long term (current) drug therapy: Secondary | ICD-10-CM

## 2024-08-09 NOTE — Progress Notes (Signed)
 See OV from 08/09/24 for complete documentation.   Alexis Chandler, PharmD, Alexis Chandler, CPP Clinical Pharmacist [facility] & [facility] [phone]

## 2024-08-09 NOTE — Telephone Encounter (Signed)
 Pharmacy Patient Advocate Encounter   Received notification from Pt Calls Messages that prior authorization for Humira  CF is required/requested.   Insurance verification completed.   The patient is insured through [facility].   Per test claim: PA required; PA submitted to above mentioned insurance via Latent Key/confirmation #/EOC [id number] Status is pending

## 2024-08-09 NOTE — Progress Notes (Signed)
 Transfer from [facility] received. Prescription is written for Humira  CF which is not covered. Called and left a message on nurse line for patient's provider to have prescription changed to regular formulation.   PA started for CF, but will likely be denied

## 2024-08-09 NOTE — Progress Notes (Signed)
" °  S: Patient presents for review of their specialty medication therapy.  Patient is currently taking Humira  for hidradenitis suppurativa. Patient is managed by Dr. Hettie for this.   Adherence: has not started   Efficacy: has not started   Dosing:  Hidradenitis suppurativa (Humira  only): SubQ: Initial: 160 mg (given as four 40 mg injections on day 1 or given as two 40 mg injections per day over 2 consecutive days), then 80 mg 2 weeks later (day 15). Maintenance: 40 mg every week beginning day 29.  Dose adjustments: Renal: no dose adjustments (has not been studied) Hepatic: no dose adjustments (has not been studied)  Drug-drug interactions: none identified   Screening: TB test: completed  Hepatitis: completed   Monitoring: S/sx of infection: CBC: S/sx of hypersensitivity: S/sx of malignancy: S/sx of heart failure:   Other side effects:   O:     Lab Results  Component Value Date   WBC 8.1 04/17/2024   HGB 12.9 04/17/2024   HCT 40.0 04/17/2024   MCV 83.2 04/17/2024   PLT 365 04/17/2024     Chemistry      Component Value Date/Time   NA 138 04/17/2024 1816   K 3.7 04/17/2024 1816   CL 108 04/17/2024 1816   CO2 20 (L) 04/17/2024 1816   BUN 16 04/17/2024 1816   CREATININE 1.19 (H) 04/17/2024 1816      Component Value Date/Time   CALCIUM 9.2 04/17/2024 1816   ALKPHOS 93 04/24/2023 2336   AST 18 04/24/2023 2336   ALT 28 04/24/2023 2336   BILITOT 0.4 04/24/2023 2336       A/P: 1. Medication review: Patient currently on Humira  for HS. Reviewed the medication with the patient, including the following: Humira  is a TNF blocking agent indicated for ankylosing spondylitis, Crohn's disease, Hidradenitis suppurativa, psoriatic arthritis, plaque psoriasis, ulcerative colitis, and uveitis. Patient educated on purpose, proper use and potential adverse effects of Humira . Possible adverse effects are increased risk of infections, headache, and injection site  reactions. There is the possibility of an increased risk of malignancy but it is not well understood if this increased risk is due to there medication or the disease state. There are rare cases of pancytopenia and aplastic anemia. For SubQ injection at separate sites in the thigh or lower abdomen (avoiding areas within 2 inches of navel); rotate injection sites. May leave at room temperature for ~15 to 30 minutes prior to use; do not remove cap or cover while allowing product to reach room temperature. Do not use if solution is discolored or contains particulate matter. Do not administer to skin which is red, tender, bruised, hard, or that has scars, stretch marks, or psoriasis plaques. Needle cap of the prefilled syringe or needle cover for the adalimumab  pen may contain latex. Prefilled pens and syringes are available for use by patients and the full amount of the syringe should be injected (self-administration); the vial is intended for institutional use only. Vials do not contain a preservative; discard unused portion. No recommendations for any changes at this time.  Herlene Fleeta Morris, PharmD, JAQUELINE, CPP Clinical Pharmacist [facility] & [facility] [phone]          "

## 2024-08-10 ENCOUNTER — Other Ambulatory Visit: Payer: Self-pay

## 2024-08-10 NOTE — Progress Notes (Signed)
 PA documentation in another encounter. CF denied and Humira  40mg /0.67ml pens approved.   Office is aware and planning to send new prescriptions once provider has signed off

## 2024-08-10 NOTE — Telephone Encounter (Signed)
 Pharmacy Patient Advocate Encounter  Received notification from [facility] that Prior Authorization for Humira  has been APPROVED from 08/09/24 to 01/29/25   PA #/Case ID/Reference #: [id number]  Loading dose approved- qty of 6 pens per 28 days. 08/09/24-09/08/24  Maintenance dose approved-qty of 4 pens per 28 days. 09/01/24-01/29/25

## 2024-08-10 NOTE — Telephone Encounter (Signed)
 Pharmacy Patient Advocate Encounter  Received notification from [employer] that Prior Authorization for Humira  has been DENIED.      PA #/Case ID/Reference #: [id number]     Office aware CF not covered. They are planning to send in new prescriptions for regular formulation.

## 2024-08-11 ENCOUNTER — Telehealth: Payer: Self-pay

## 2024-08-11 ENCOUNTER — Other Ambulatory Visit: Payer: Self-pay

## 2024-08-11 NOTE — Telephone Encounter (Signed)
 Copied from CRM #[id number]. Topic: Clinical - Medication Question >> Aug 11, 2024  5:29 PM Hadassah PARAS wrote: Reason for CRM: Pt would like an update on PA Med for Humira . Please call pt back on #[phone]

## 2024-08-12 ENCOUNTER — Other Ambulatory Visit: Payer: Self-pay

## 2024-08-12 NOTE — Telephone Encounter (Signed)
 Hey friend. Are you able to provide this patient an update on her PA for Humira ?

## 2024-08-13 ENCOUNTER — Other Ambulatory Visit: Payer: Self-pay

## 2024-08-13 NOTE — Progress Notes (Signed)
 Left message for nurse again. We still need a prescription for the loading dose and maintenance of the regular formulation Humira .   Updated pt via mychart and advised her to reach out to office as well to maybe expedite the process.

## 2024-08-16 ENCOUNTER — Other Ambulatory Visit: Payer: Self-pay

## 2024-08-17 ENCOUNTER — Other Ambulatory Visit: Payer: Self-pay

## 2024-08-19 ENCOUNTER — Other Ambulatory Visit: Payer: Self-pay

## 2024-08-20 ENCOUNTER — Other Ambulatory Visit: Payer: Self-pay | Admitting: Pharmacist

## 2024-08-20 ENCOUNTER — Other Ambulatory Visit: Payer: Self-pay

## 2024-08-20 MED ORDER — HUMIRA (2 PEN) 80 MG/0.8ML ~~LOC~~ AJKT: AUTO-INJECTOR | SUBCUTANEOUS | 0 refills | Status: DC

## 2024-08-20 MED ORDER — HUMIRA (2 PEN) 80 MG/0.8ML ~~LOC~~ AJKT
AUTO-INJECTOR | SUBCUTANEOUS | 11 refills | Status: AC
  Filled 2024-08-24: qty 1.6, fill #0

## 2024-08-20 MED ORDER — HUMIRA (2 PEN) 80 MG/0.8ML ~~LOC~~ AJKT
AUTO-INJECTOR | SUBCUTANEOUS | 0 refills | Status: AC
  Filled 2024-08-24: qty 2.4, fill #0

## 2024-08-20 MED ORDER — HUMIRA (2 PEN) 80 MG/0.8ML ~~LOC~~ AJKT: AUTO-INJECTOR | SUBCUTANEOUS | 11 refills | Status: DC

## 2024-08-24 ENCOUNTER — Other Ambulatory Visit: Payer: Self-pay

## 2024-08-31 ENCOUNTER — Other Ambulatory Visit (HOSPITAL_COMMUNITY): Payer: Self-pay

## 2024-08-31 ENCOUNTER — Other Ambulatory Visit: Payer: Self-pay

## 2024-08-31 MED ORDER — HUMIRA (2 SYRINGE) 40 MG/0.8ML ~~LOC~~ PSKT: 80.0000 mg | PREFILLED_SYRINGE | SUBCUTANEOUS | 11 refills | Status: AC

## 2024-08-31 MED ORDER — HUMIRA (2 PEN) 40 MG/0.8ML ~~LOC~~ AJKT: AUTO-INJECTOR | SUBCUTANEOUS | 0 refills | Status: AC

## 2024-08-31 NOTE — Progress Notes (Signed)
 New Prescriptions have been received and profiled!

## 2024-09-01 ENCOUNTER — Other Ambulatory Visit: Payer: Self-pay
